# Patient Record
Sex: Female | Born: 1980 | Race: Black or African American | Marital: Single | Smoking: Current every day smoker
Health system: Southern US, Community
[De-identification: ages and names within clinical notes are randomized; demographics above are authoritative.]

## PROBLEM LIST (undated history)

## (undated) DIAGNOSIS — T7840XA Allergy, unspecified, initial encounter: Secondary | ICD-10-CM

## (undated) HISTORY — DX: Allergy, unspecified, initial encounter: T78.40XA

---

## 2002-03-30 ENCOUNTER — Emergency Department (HOSPITAL_COMMUNITY): Admission: EM | Admit: 2002-03-30 | Discharge: 2002-03-30 | Payer: Self-pay | Admitting: Emergency Medicine

## 2004-04-14 ENCOUNTER — Other Ambulatory Visit: Admission: RE | Admit: 2004-04-14 | Discharge: 2004-04-14 | Payer: Self-pay | Admitting: Obstetrics and Gynecology

## 2008-03-28 ENCOUNTER — Emergency Department (HOSPITAL_COMMUNITY): Admission: EM | Admit: 2008-03-28 | Discharge: 2008-03-28 | Payer: Self-pay | Admitting: Emergency Medicine

## 2009-10-25 ENCOUNTER — Emergency Department (HOSPITAL_COMMUNITY): Admission: EM | Admit: 2009-10-25 | Discharge: 2009-10-25 | Payer: Self-pay | Admitting: Emergency Medicine

## 2010-06-03 ENCOUNTER — Encounter: Payer: Self-pay | Admitting: Vascular Surgery

## 2010-07-04 LAB — BASIC METABOLIC PANEL
CO2: 21 mEq/L (ref 19–32)
Chloride: 106 mEq/L (ref 96–112)
GFR calc non Af Amer: >60 mL/min (ref >60)
Glucose, Bld: 100 mg/dL — ABNORMAL HIGH (ref 70–99)
Potassium: 3.9 mEq/L (ref 3.5–5.1)
Sodium: 136 mEq/L (ref 135–145)

## 2010-07-04 LAB — URINALYSIS, ROUTINE W REFLEX MICROSCOPIC
Leukocytes, UA: NEGATIVE
Nitrite: NEGATIVE
Protein, ur: 100 mg/dL — AB
Urobilinogen, UA: 0.2 mg/dL (ref 0.0–1.0)

## 2010-07-04 LAB — URINE MICROSCOPIC-ADD ON

## 2010-07-08 ENCOUNTER — Encounter: Payer: Self-pay | Admitting: Vascular Surgery

## 2011-03-26 ENCOUNTER — Ambulatory Visit (INDEPENDENT_AMBULATORY_CARE_PROVIDER_SITE_OTHER): Payer: 59

## 2011-03-26 DIAGNOSIS — N760 Acute vaginitis: Secondary | ICD-10-CM

## 2011-03-26 DIAGNOSIS — B373 Candidiasis of vulva and vagina: Secondary | ICD-10-CM

## 2011-04-08 ENCOUNTER — Ambulatory Visit (INDEPENDENT_AMBULATORY_CARE_PROVIDER_SITE_OTHER): Payer: 59

## 2011-04-08 DIAGNOSIS — J209 Acute bronchitis, unspecified: Secondary | ICD-10-CM

## 2011-04-08 DIAGNOSIS — J019 Acute sinusitis, unspecified: Secondary | ICD-10-CM

## 2011-06-26 ENCOUNTER — Telehealth: Payer: Self-pay

## 2011-06-26 ENCOUNTER — Ambulatory Visit (INDEPENDENT_AMBULATORY_CARE_PROVIDER_SITE_OTHER): Payer: 59 | Admitting: Emergency Medicine

## 2011-06-26 VITALS — BP 120/79 | HR 97 | Temp 98.6°F | Resp 16 | Ht 64.0 in | Wt 161.4 lb

## 2011-06-26 DIAGNOSIS — B86 Scabies: Secondary | ICD-10-CM

## 2011-06-26 DIAGNOSIS — L299 Pruritus, unspecified: Secondary | ICD-10-CM

## 2011-06-26 MED ORDER — IVERMECTIN 3 MG PO TABS: ORAL_TABLET | ORAL | Status: DC

## 2011-06-26 NOTE — Patient Instructions (Signed)
Scabies Scabies are small bugs (mites) that burrow under the skin and cause red bumps and severe itching. These bugs can only be seen with a microscope. Scabies are highly contagious. They can spread easily from person to person by direct contact. They are also spread through sharing clothing or linens that have the scabies mites living in them. It is not unusual for an entire family to become infected through shared towels, clothing, or bedding.  HOME CARE INSTRUCTIONS   Your caregiver may prescribe a cream or lotion to kill the mites. If this cream is prescribed; massage the cream into the entire area of the body from the neck to the bottom of both feet. Also massage the cream into the scalp and face if your child is less than 1 year old. Avoid the eyes and mouth.   Leave the cream on for 8 to12 hours. Do not wash your hands after application. Your child should bathe or shower after the 8 to 12 hour application period. Sometimes it is helpful to apply the cream to your child at right before bedtime.   One treatment is usually effective and will eliminate approximately 95% of infestations. For severe cases, your caregiver may decide to repeat the treatment in 1 week. Everyone in your household should be treated with one application of the cream.   New rashes or burrows should not appear after successful treatment within 24 to 48 hours; however the itching and rash may last for 2 to 4 weeks after successful treatment. If your symptoms persist longer than this, see your caregiver.   Your caregiver also may prescribe a medication to help with the itching or to help the rash go away more quickly.   Scabies can live on clothing or linens for up to 3 days. Your entire child's recently used clothing, towels, stuffed toys, and bed linens should be washed in hot water and then dried in a dryer for at least 20 minutes on high heat. Items that cannot be washed should be enclosed in a plastic bag for at least 3  days.   To help relieve itching, bathe your child in a cool bath or apply cool washcloths to the affected areas.   Your child may return to school after treatment with the prescribed cream.  SEEK MEDICAL CARE IF:   The itching persists longer than 4 weeks after treatment.   The rash spreads or becomes infected (the area has red blisters or yellow-tan crust).  Document Released: 03/06/2005 Document Revised: 02/23/2011 Document Reviewed: 07/15/2008 ExitCare Patient Information 2012 [employer]. 

## 2011-06-26 NOTE — Progress Notes (Signed)
  Subjective:    Patient ID: Lindsay Marsh, female    DOB: January 04, 1981, 31 y.o.   MRN: [id number]  HPI she presents with onset last week of a pruritic rash which started on the back of her right HEENT and now involves the inside of her left arm and also her trunk. These bumps are extremely pruritic    Review of Systems noncontributory except as relates to this illness. She does not have a partner no children.     Objective:   Physical Exam raised approximately 2-3 mm firm nodular areas with central excoriation which are present on the dorsum of the right hand inside of the left arm anterior axillary line on the right and also over the her back to        Assessment & Plan:  These lesions are most consistent with scabies. It is possible these could be molluscum Halladay extremely pruritic and not really umbilicated they look more excoriated.

## 2011-06-26 NOTE — Telephone Encounter (Signed)
PT STATES SHE WAS JUST SEEN BY DR DAUB AND HE THINK SHE MAY HAVE SCABIES AND WANTED TO MAKE SURE SHE WASN'T AROUND ANYONE. HER MOM IS EDWINA PEARSON DOB [id number] AND SHE HAVE BEEN IN CLOSE CONTACT WITH HER SHE HAS NEVER BEEN SEEN HERE BEFORE, BUT PT WOULD LIKE TO KNOW IF WOULD CALL HER SOMETHING IN PLEASE CALL [phone]    [facility]

## 2011-06-27 NOTE — Telephone Encounter (Signed)
Dr Cleta Alberts, do you want to send in a Rx for pts mother?

## 2011-06-27 NOTE — Telephone Encounter (Signed)
Lindsay Marsh please call the patient's mother and see if she has a rash. If she has a rash M1 like a prescription called in find out the patient's weight because the treatment is based on the patient's weight. I will be happy to call in a prescription she also needs to give [place] the name of the pharmacy her full name and date of birth for me to call that in.

## 2011-06-28 NOTE — Telephone Encounter (Signed)
Please call in ivermectin 3 mg take 5 pills one dose no refills

## 2011-06-28 NOTE — Telephone Encounter (Signed)
Called in Rx and notified pt 

## 2011-06-28 NOTE — Telephone Encounter (Signed)
Pt states she doesn't think her mother actually has any rash, but she was touching the pt's rashes, etc and is very worried about getting scabies. Can we Rx medication for her? Her weight is about 160 lbs, name Lindsay Marsh, DOB [birth year] and she also uses [facility]

## 2011-07-13 ENCOUNTER — Telehealth: Payer: Self-pay

## 2011-07-13 MED ORDER — IVERMECTIN 3 MG PO TABS: ORAL_TABLET | ORAL | Status: DC

## 2011-07-13 NOTE — Telephone Encounter (Signed)
It can be common to need re-treatment. Another Rx has been sent in.  Lindsay Marsh

## 2011-07-13 NOTE — Telephone Encounter (Signed)
.  [facility] PATIENT STATES SHE SAW DR. DAUB A FEW WEEKS AGO FOR SCABIES. SHE IS STILL ITCHING. SHE FINISHED THE ONE TIME DOSE OF MEDICATION. WHEN SHOULD THE ITCHING STOP? BEST PHONE [phone]         [facility]      MBC

## 2011-07-13 NOTE — Telephone Encounter (Signed)
Should we have her come back in or is this common with scabies.

## 2011-07-13 NOTE — Telephone Encounter (Signed)
Called patient. Let her know that another script was called in.

## 2011-07-19 ENCOUNTER — Ambulatory Visit (INDEPENDENT_AMBULATORY_CARE_PROVIDER_SITE_OTHER): Payer: 59 | Admitting: Emergency Medicine

## 2011-07-19 ENCOUNTER — Telehealth: Payer: Self-pay

## 2011-07-19 VITALS — BP 133/76 | HR 97 | Temp 99.2°F | Resp 16 | Ht 63.0 in | Wt 160.0 lb

## 2011-07-19 DIAGNOSIS — B86 Scabies: Secondary | ICD-10-CM

## 2011-07-19 DIAGNOSIS — L708 Other acne: Secondary | ICD-10-CM

## 2011-07-19 DIAGNOSIS — L709 Acne, unspecified: Secondary | ICD-10-CM

## 2011-07-19 MED ORDER — BENZOYL PEROXIDE-ERYTHROMYCIN 5-3 % EX GEL: Freq: Two times a day (BID) | CUTANEOUS | Status: DC

## 2011-07-19 NOTE — Telephone Encounter (Signed)
Pt states that she was diagnosed with having scabies, she is beginning to have bumps on her face now but the bumps on her body are clearing up. Pt would like to know what should she do.

## 2011-07-19 NOTE — Patient Instructions (Signed)
Acne  Acne is a skin problem that causes pimples. Acne occurs when the pores in your skin get blocked. Your pores may become red, sore, and swollen (inflamed), or infected with a common skin bacterium (Propionibacterium acnes). Acne is a common skin problem. Up to 80% of people get acne at some time. Acne is especially common from the ages of 12 to 24. Acne usually goes away over time with proper treatment.  CAUSES    Your pores each contain an oil gland. The oil glands make an oily substance called sebum. Acne happens when these glands get plugged with sebum, dead skin cells, and dirt. The P. acnes bacteria that are normally found in the oil glands then multiply, causing inflammation. Acne is commonly triggered by changes in your hormones. These hormonal changes can cause the oil glands to get bigger and to make more sebum. Factors that can make acne worse include:   Hormone changes during adolescence.    Hormone changes during women's menstrual cycles.    Hormone changes during pregnancy.    Oil-based cosmetics and hair products.    Harshly scrubbing the skin.    Strong soaps.    Stress.    Hormone problems due to certain diseases.    Long or oily hair rubbing against the skin.    Certain medicines.    Pressure from headbands, backpacks, or shoulder pads.    Exposure to certain oils and chemicals.   SYMPTOMS    Acne often occurs on the face, neck, chest, and upper back. Symptoms include:   Small, red bumps (pimples or papules).    Whiteheads (closed comedones).    Blackheads (open comedones).    Small, pus-filled pimples (pustules).    Big, red pimples or pustules that feel tender.   More severe acne can cause:   An infected area that contains a collection of pus (abscess).    Hard, painful, fluid-filled sacs (cysts).    Scars.   DIAGNOSIS   Your caregiver can usually tell what the problem is by doing a physical exam.  TREATMENT     There are many good treatments for acne. Some are available over-the-counter and some are available with a prescription. The treatment that is best for you depends on the type of acne you have and how severe it is. It may take 2 months of treatment before your acne gets better. Common treatments include:   Creams and lotions that prevent oil glands from clogging.    Creams and lotions that treat or prevent infections and inflammation.    Antibiotics applied to the skin or taken as a pill.    Pills that decrease sebum production.    Birth control pills.    Light or laser treatments.    Minor surgery.    Injections of medicine into the affected areas.    Chemicals that cause peeling of the skin.   HOME CARE INSTRUCTIONS    Good skin care is the most important part of treatment.   Wash your skin gently at least twice a day and after exercise. Always wash your skin before bed.    Use mild soap.    After each wash, apply a water-based skin moisturizer.    Keep your hair clean and off of your face. Shampoo your hair daily.    Only take medicines as directed by your caregiver.    Use a sunscreen or sunblock with SPF 30 or greater. This is especially important when you are using acne   medicines.    Choose cosmetics that are noncomedogenic. This means they do not plug the oil glands.    Avoid leaning your chin or forehead on your hands.    Avoid wearing tight headbands or hats.    Avoid picking or squeezing your pimples. This can make your acne worse and cause scarring.   SEEK MEDICAL CARE IF:     Your acne is not better after 8 weeks.    Your acne gets worse.    You have a large area of skin that is red or tender.   Document Released: 03/03/2000 Document Revised: 02/23/2011 Document Reviewed: 12/23/2010  ExitCare Patient Information 2012 [employer].

## 2011-07-19 NOTE — Telephone Encounter (Signed)
Spoke with pt--she will rtc for eval.

## 2011-07-19 NOTE — Telephone Encounter (Signed)
SPOKE WITH PT--SAW DR Cleta Alberts AT BEGINNING OF April. MAJORITY OF SCABIES BUMPS WENT DOWN. HE RE-PRESCRIBED THE MED LAST Thursday. SHE SAYS SHE NOW IS BREAKING OUT ON HER FACE, IN A FORMATION OF A LINE--LIKE IT WAS ON HER CHEST. PT WANTS TO KNOW WHAT SHE SHOULD DO NOW.

## 2011-07-19 NOTE — Progress Notes (Signed)
  Subjective:    Patient ID: Lindsay Marsh, female    DOB: [birth year], 31 y.o.   MRN: [id number]  HPI patient enters for followup of her scabies. She was treated with ivermectin. The areas beneath both arms have cleared. She has an outbreak on both cheeks. This is slightly  pruritic on the left. She has no history of acne.    Review of Systems chest as relates to her skin and the present illness     Objective:   Physical Exam there is an acneform eruption of both cheeks. The areas where she had her scabies on the inside of both arms and hands are now clear.        Assessment & Plan:  Her scabies outbreak is now subsided. She has an acne eruption on her face and we'll treat with topical Benzamycin.

## 2011-07-19 NOTE — Telephone Encounter (Signed)
Dr. Cleta Alberts is here today at 2 pm, please return to clinic for eval.

## 2011-07-24 ENCOUNTER — Encounter (HOSPITAL_COMMUNITY): Payer: Self-pay | Admitting: Emergency Medicine

## 2011-07-24 ENCOUNTER — Emergency Department (HOSPITAL_COMMUNITY)
Admission: EM | Admit: 2011-07-24 | Discharge: 2011-07-24 | Disposition: A | Discharge status: Home / Self Care | Payer: 59 | Attending: Emergency Medicine | Admitting: Emergency Medicine

## 2011-07-24 DIAGNOSIS — B86 Scabies: Secondary | ICD-10-CM | POA: Insufficient documentation

## 2011-07-24 MED ORDER — PERMETHRIN 5 % EX CREA: TOPICAL_CREAM | CUTANEOUS | Status: AC

## 2011-07-24 NOTE — Discharge Instructions (Signed)
Scabies Scabies are small bugs (mites) that burrow under the skin and cause red bumps and severe itching. These bugs can only be seen with a microscope. Scabies are highly contagious. They can spread easily from person to person by direct contact. They are also spread through sharing clothing or linens that have the scabies mites living in them. It is not unusual for an entire family to become infected through shared towels, clothing, or bedding.  HOME CARE INSTRUCTIONS   Your caregiver may prescribe a cream or lotion to kill the mites. If this cream is prescribed; massage the cream into the entire area of the body from the neck to the bottom of both feet. Also massage the cream into the scalp and face if your child is less than 1 year old. Avoid the eyes and mouth.   Leave the cream on for 8 to12 hours. Do not wash your hands after application. Your child should bathe or shower after the 8 to 12 hour application period. Sometimes it is helpful to apply the cream to your child at right before bedtime.   One treatment is usually effective and will eliminate approximately 95% of infestations. For severe cases, your caregiver may decide to repeat the treatment in 1 week. Everyone in your household should be treated with one application of the cream.   New rashes or burrows should not appear after successful treatment within 24 to 48 hours; however the itching and rash may last for 2 to 4 weeks after successful treatment. If your symptoms persist longer than this, see your caregiver.   Your caregiver also may prescribe a medication to help with the itching or to help the rash go away more quickly.   Scabies can live on clothing or linens for up to 3 days. Your entire child's recently used clothing, towels, stuffed toys, and bed linens should be washed in hot water and then dried in a dryer for at least 20 minutes on high heat. Items that cannot be washed should be enclosed in a plastic bag for at least 3  days.   To help relieve itching, bathe your child in a cool bath or apply cool washcloths to the affected areas.   Your child may return to school after treatment with the prescribed cream.  SEEK MEDICAL CARE IF:   The itching persists longer than 4 weeks after treatment.   The rash spreads or becomes infected (the area has red blisters or yellow-tan crust).  Document Released: 03/06/2005 Document Revised: 02/23/2011 Document Reviewed: 07/15/2008 ExitCare Patient Information 2012 [employer]. 

## 2011-07-24 NOTE — ED Notes (Signed)
PA NOTIFIED ON PT'S LOCATION / COMPLAINTS.

## 2011-07-24 NOTE — ED Notes (Signed)
PT. LEFT AFTER RECEIVING DISCHARGE INSTRUCTIONS / P[RESCRIPTION , DID NOT WAIT FOR PA TO ANSWER HER QUESTIONS AT DISCHARGE.

## 2011-07-24 NOTE — ED Provider Notes (Signed)
History     CSN: [id number]  Arrival date & time 07/24/11  4098   First MD Initiated Contact with Patient 07/24/11 2212      Chief Complaint  Patient presents with  . Rash    (Consider location/radiation/quality/duration/timing/severity/associated sxs/prior treatment) HPI Comments: Patient here with recurrent rash. States was treated at an urgent care several weeks ago and given medication for the rash. States it continues to return. States itching and scalp itching as well. States no one else has the rash was given refills or "scabies ". Denies fevers chills. Would like referral to dermatology.  Patient is a 31 y.o. female presenting with rash. The history is provided by the patient. No language interpreter was used.  Rash  This is a new problem. The current episode started more than 1 week ago. The problem has not changed since onset.The problem is associated with nothing. There has been no fever. The rash is present on the trunk, right arm and left arm. The pain is at a severity of 0/10. The patient is experiencing no pain. The pain has been constant since onset. Associated symptoms include itching. Pertinent negatives include no blisters, no pain and no weeping. She has tried nothing for the symptoms. The treatment provided no relief.    History reviewed. No pertinent past medical history.  History reviewed. No pertinent past surgical history.  No family history on file.  History  Substance Use Topics  . Smoking status: Current Everyday Smoker -- 0.3 packs/day  . Smokeless tobacco: Not on file  . Alcohol Use: Yes    OB History    Grav Para Term Preterm Abortions TAB SAB Ect Mult Living                  Review of Systems  Skin: Positive for itching and rash.  All other systems reviewed and are negative.    Allergies  Review of patient's allergies indicates no known allergies.  Home Medications   Current Outpatient Rx  Name Route Sig Dispense Refill  .  LORATADINE-PSEUDOEPHEDRINE ER 10-240 MG PO TB24 Oral Take 1 tablet by mouth daily as needed. For allergies      BP 135/91  Pulse 87  Temp(Src) 98.2 F (36.8 C) (Oral)  Resp 18  SpO2 100%  LMP 07/15/2011  Physical Exam  Nursing note and vitals reviewed. Constitutional: She is oriented to person, place, and time. She appears well-developed and well-nourished. No distress.  HENT:  Head: Normocephalic and atraumatic.  Right Ear: External ear normal.  Left Ear: External ear normal.  Nose: Nose normal.  Mouth/Throat: Oropharynx is clear and moist. No oropharyngeal exudate.  Eyes: Conjunctivae are normal. Pupils are equal, round, and reactive to light. No scleral icterus.  Neck: Normal range of motion. Neck supple.  Cardiovascular: Normal rate, regular rhythm and normal heart sounds.  Exam reveals no gallop and no friction rub.   No murmur heard. Pulmonary/Chest: Effort normal and breath sounds normal. No respiratory distress. She has no wheezes. She has no rales. She exhibits no tenderness.  Abdominal: Soft. Bowel sounds are normal. She exhibits no distension and no mass. There is no tenderness. There is no rebound and no guarding.  Musculoskeletal: Normal range of motion. She exhibits no edema and no tenderness.  Lymphadenopathy:    She has no cervical adenopathy.  Neurological: She is alert and oriented to person, place, and time. No cranial nerve deficit.  Skin: Skin is warm and dry. Rash noted. No erythema. No pallor.  Diffuse papular rash noted to bilateral upper arms chest. Reports this rash is itchy.  Psychiatric: She has a normal mood and affect. Her behavior is normal. Judgment and thought content normal.    ED Course  Procedures (including critical care time)  Labs Reviewed - No data to display No results found.   Scabies    MDM  Patient treated previously with pills for scabies will place her on a cream for this. And have her followup with  dermatology.        Izola Price [place], [place] 07/24/11 [phone]

## 2011-07-24 NOTE — ED Notes (Signed)
NURSE EXPLAINED DELAY AND PROCESS.  

## 2011-07-24 NOTE — ED Notes (Signed)
PT. REPROTS PERSISTENT RASH AT ARMS , HANDS AND BACK FOR SEVERAL DAYS , SEEN AT AN URGENT  CARE SEVERAL WEEKS AGO DIAGNOSED WITH SCABIES.

## 2011-07-25 NOTE — ED Provider Notes (Signed)
Medical screening examination/treatment/procedure(s) were performed by non-physician practitioner and as supervising physician I was immediately available for consultation/collaboration. Osvaldo Human, M.D.  Carleene Cooper III, MD 07/25/11 1116

## 2011-11-18 ENCOUNTER — Ambulatory Visit (INDEPENDENT_AMBULATORY_CARE_PROVIDER_SITE_OTHER): Payer: 59 | Admitting: Family Medicine

## 2011-11-18 VITALS — BP 114/78 | HR 89 | Temp 98.4°F | Resp 16 | Ht 63.5 in | Wt 161.8 lb

## 2011-11-18 DIAGNOSIS — Z3200 Encounter for pregnancy test, result unknown: Secondary | ICD-10-CM

## 2011-11-18 DIAGNOSIS — J329 Chronic sinusitis, unspecified: Secondary | ICD-10-CM

## 2011-11-18 DIAGNOSIS — N760 Acute vaginitis: Secondary | ICD-10-CM

## 2011-11-18 LAB — POCT URINALYSIS DIPSTICK
Bilirubin, UA: NEGATIVE
Glucose, UA: NEGATIVE
Ketones, UA: NEGATIVE
Leukocytes, UA: NEGATIVE
Nitrite, UA: NEGATIVE
Protein, UA: NEGATIVE
Spec Grav, UA: 1.02
Urobilinogen, UA: 0.2
pH, UA: 7

## 2011-11-18 LAB — POCT UA - MICROSCOPIC ONLY
Casts, Ur, LPF, POC: NEGATIVE
Crystals, Ur, HPF, POC: NEGATIVE
Yeast, UA: NEGATIVE

## 2011-11-18 LAB — POCT WET PREP WITH KOH
KOH Prep POC: NEGATIVE
Trichomonas, UA: NEGATIVE
Yeast Wet Prep HPF POC: NEGATIVE

## 2011-11-18 LAB — POCT URINE PREGNANCY: Preg Test, Ur: NEGATIVE

## 2011-11-18 MED ORDER — CLINDAMYCIN HCL 150 MG PO CAPS: 150.0000 mg | ORAL_CAPSULE | Freq: Three times a day (TID) | ORAL | Status: AC

## 2011-11-18 NOTE — Patient Instructions (Addendum)

## 2011-11-18 NOTE — Progress Notes (Signed)
31 year old [place] female is here today with complaints of Sinus pressure, Headache, nasal congestion (green mucous), cough- dry and chest comgestion for five days. Pt also states that she has had vaginal discomfort for a week and a half.  Pt states that she has no discharge or odor but a little itchy. Pt states she a history of bacterial or yeast  infection without showing or having any symptoms. Pt states the last time she was prescribed Flagyl, she feels like she had a reaction to the antibiotic because of a rash that appeared on the right side of her back and elbow. Pt states she has no other complaints.  Works in accounts payable.  No epistaxis or recreational drugs  Vaginal sx:  Patient requests STS check. LMP:  17 Aug G1Ab1 Contraception:  None Last Pap:  This year; normal  Objective:  NAD Nose:  Erosion lateral left nostril just inside Oroph:  Clear Neck:  Supple, no adenopathy TM:  Normal Chest:  Clear  EXT genit: normal Vagina: white mucoid discharge Bimanual:  Mildly deviated uterus to the right, no masses or adnexal swelling  Results for orders placed in visit on 11/18/11  POCT URINALYSIS DIPSTICK      Component Value Range   Color, UA yellow     Clarity, UA hazy     Glucose, UA neg     Bilirubin, UA neg     Ketones, UA neg     Spec Grav, UA 1.020     Blood, UA moderate     pH, UA 7.0     Protein, UA neg     Urobilinogen, UA 0.2     Nitrite, UA neg     Leukocytes, UA Negative    POCT UA - MICROSCOPIC ONLY      Component Value Range   WBC, Ur, HPF, POC 2-3     RBC, urine, microscopic 8-12     Bacteria, U Microscopic small     Mucus, UA trace     Epithelial cells, urine per micros 2-5     Crystals, Ur, HPF, POC neg     Casts, Ur, LPF, POC neg     Yeast, UA neg    POCT WET PREP WITH KOH      Component Value Range   Trichomonas, UA Negative     Clue Cells Wet Prep HPF POC 0-2     Epithelial Wet Prep HPF POC 5-8     Yeast Wet Prep HPF POC neg     Bacteria  Wet Prep HPF POC 1+     RBC Wet Prep HPF POC 0-2     WBC Wet Prep HPF POC 2-4     KOH Prep POC Negative    POCT URINE PREGNANCY      Component Value Range   Preg Test, Ur Negative       Assessment:  Sinusitis with BV  Plan:  Clindamycin 150 tid x 7 days  I've warned patient about GI side effects and asked her to return in 2 to 4 weeks to recheck the left nasal lesion  Also, advised that she taper and quit the cigarettes.

## 2011-11-21 LAB — GC/CHLAMYDIA PROBE AMP, GENITAL
Chlamydia, DNA Probe: NEGATIVE
GC Probe Amp, Genital: NEGATIVE

## 2013-02-02 ENCOUNTER — Ambulatory Visit (INDEPENDENT_AMBULATORY_CARE_PROVIDER_SITE_OTHER): Payer: 59 | Admitting: Emergency Medicine

## 2013-02-02 VITALS — BP 134/80 | HR 94 | Temp 98.3°F | Resp 16 | Ht 63.5 in | Wt 177.8 lb

## 2013-02-02 DIAGNOSIS — A499 Bacterial infection, unspecified: Secondary | ICD-10-CM

## 2013-02-02 DIAGNOSIS — N898 Other specified noninflammatory disorders of vagina: Secondary | ICD-10-CM

## 2013-02-02 DIAGNOSIS — B9689 Other specified bacterial agents as the cause of diseases classified elsewhere: Secondary | ICD-10-CM

## 2013-02-02 DIAGNOSIS — N760 Acute vaginitis: Secondary | ICD-10-CM

## 2013-02-02 LAB — POCT CBC
Granulocyte percent: 43.2 %G (ref 37–80)
MID (cbc): 0.4 (ref 0–0.9)
POC Granulocyte: 2 (ref 2–6.9)
POC LYMPH PERCENT: 49.1 %L (ref 10–50)
POC MID %: 7.7 %M (ref 0–12)
Platelet Count, POC: 304 10*3/uL (ref 142–424)
RDW, POC: 13.4 %

## 2013-02-02 LAB — POCT WET PREP WITH KOH
Epithelial Wet Prep HPF POC: NEGATIVE
RBC Wet Prep HPF POC: NEGATIVE

## 2013-02-02 LAB — POCT URINALYSIS DIPSTICK
Bilirubin, UA: NEGATIVE
Ketones, UA: NEGATIVE
Leukocytes, UA: NEGATIVE
Nitrite, UA: NEGATIVE

## 2013-02-02 LAB — POCT UA - MICROSCOPIC ONLY

## 2013-02-02 LAB — POCT URINE PREGNANCY: Preg Test, Ur: NEGATIVE

## 2013-02-02 MED ORDER — CLINDAMYCIN PHOSPHATE 2 % VA CREA: 1.0000 | TOPICAL_CREAM | Freq: Every day | VAGINAL | Status: DC

## 2013-02-02 NOTE — Patient Instructions (Signed)
Bacterial Vaginosis Bacterial vaginosis (BV) is a vaginal infection where the normal balance of bacteria in the vagina is disrupted. The normal balance is then replaced by an overgrowth of certain bacteria. There are several different kinds of bacteria that can cause BV. BV is the most common vaginal infection in women of childbearing age. CAUSES   The cause of BV is not fully understood. BV develops when there is an increase or imbalance of harmful bacteria.  Some activities or behaviors can upset the normal balance of bacteria in the vagina and put women at increased risk including:  Having a new sex partner or multiple sex partners.  Douching.  Using an intrauterine device (IUD) for contraception.  It is not clear what role sexual activity plays in the development of BV. However, women that have never had sexual intercourse are rarely infected with BV. Women do not get BV from toilet seats, bedding, swimming pools or from touching objects around them.  SYMPTOMS   Grey vaginal discharge.  A fish-like odor with discharge, especially after sexual intercourse.  Itching or burning of the vagina and vulva.  Burning or pain with urination.  Some women have no signs or symptoms at all. DIAGNOSIS  Your caregiver must examine the vagina for signs of BV. Your caregiver will perform lab tests and look at the sample of vaginal fluid through a microscope. They will look for bacteria and abnormal cells (clue cells), a pH test higher than 4.5, and a positive amine test all associated with BV.  RISKS AND COMPLICATIONS   Pelvic inflammatory disease (PID).  Infections following gynecology surgery.  Developing HIV.  Developing herpes virus. TREATMENT  Sometimes BV will clear up without treatment. However, all women with symptoms of BV should be treated to avoid complications, especially if gynecology surgery is planned. Female partners generally do not need to be treated. However, BV may spread  between female sex partners so treatment is helpful in preventing a recurrence of BV.   BV may be treated with antibiotics. The antibiotics come in either pill or vaginal cream forms. Either can be used with nonpregnant or pregnant women, but the recommended dosages differ. These antibiotics are not harmful to the baby.  BV can recur after treatment. If this happens, a second round of antibiotics will often be prescribed.  Treatment is important for pregnant women. If not treated, BV can cause a premature delivery, especially for a pregnant woman who had a premature birth in the past. All pregnant women who have symptoms of BV should be checked and treated.  For chronic reoccurrence of BV, treatment with a type of prescribed gel vaginally twice a week is helpful. HOME CARE INSTRUCTIONS   Finish all medication as directed by your caregiver.  Do not have sex until treatment is completed.  Tell your sexual partner that you have a vaginal infection. They should see their caregiver and be treated if they have problems, such as a mild rash or itching.  Practice safe sex. Use condoms. Only have 1 sex partner. PREVENTION  Basic prevention steps can help reduce the risk of upsetting the natural balance of bacteria in the vagina and developing BV:  Do not have sexual intercourse (be abstinent).  Do not douche.  Use all of the medicine prescribed for treatment of BV, even if the signs and symptoms go away.  Tell your sex partner if you have BV. That way, they can be treated, if needed, to prevent reoccurrence. SEEK MEDICAL CARE IF:     Your symptoms are not improving after 3 days of treatment.  You have increased discharge, pain, or fever. MAKE SURE YOU:   Understand these instructions.  Will watch your condition.  Will get help right away if you are not doing well or get worse. FOR MORE INFORMATION  Division of STD Prevention (DSTDP), Centers for Disease Control and Prevention:  www.cdc.gov/std American [employer] Association (ASHA): www.ashastd.org  Document Released: 03/06/2005 Document Revised: 05/29/2011 Document Reviewed: 10/16/2012 [employer] Patient Information 2014 [employer].  

## 2013-02-02 NOTE — Progress Notes (Signed)
[facility] [address] [place] [address] [phone]  Date:  02/02/2013   Name:  Charmayne Odell   DOB:  [birth year]   MRN:  [id number]  PCP:  Default, Provider, MD    Chief Complaint: Vaginal Discharge and Abdominal Pain   History of Present Illness:  Nicky Milhouse is a 32 y.o. very pleasant female patient who presents with the following:  Had an IUD removed 2 months ago.  Had intercourse once in interval and was unprotected and developed crampy pelvic pain 6 weeks later (2 weeks ago).  Has no discharge but has a foul smell.  No dysuria, urgency or frequency.  LMP just ended.  There are no active problems to display for this patient.   History reviewed. No pertinent past medical history.  History reviewed. No pertinent past surgical history.  History  Substance Use Topics  . Smoking status: Current Every Day Smoker -- 6.00 packs/day    Types: Cigarettes  . Smokeless tobacco: Current User  . Alcohol Use: Yes     Comment: Socially    Family History  Problem Relation Age of Onset  . Diabetes Maternal Grandmother   . Stroke Maternal Grandfather     Allergies  Allergen Reactions  . Flagyl [Metronidazole] Rash    Medication list has been reviewed and updated.  Current Outpatient Prescriptions on File Prior to Visit  Medication Sig Dispense Refill  . loratadine-pseudoephedrine (CLARITIN-D 24-HOUR) 10-240 MG per 24 hr tablet Take 1 tablet by mouth daily as needed. For allergies       No current facility-administered medications on file prior to visit.    Review of Systems:  As per HPI, otherwise negative.    Physical Examination: Filed Vitals:   02/02/13 0818  BP: 134/80  Pulse: 94  Temp: 98.3 F (36.8 C)  Resp: 16   Filed Vitals:   02/02/13 0818  Height: 5' 3.5" (1.613 m)  Weight: 177 lb 12.8 oz (80.65 kg)   Body mass index is 31 kg/(m^2). Ideal Body Weight: Weight in (lb) to have BMI = 25: 143.1  Physical  Examination: Pelvic - normal external genitalia, vulva, vagina, cervix, uterus and adnexa, exam chaperoned by Lurena Joiner   Assessment and Plan: BV Clindamycin due flagyl allergy  Signed,  Phillips Odor, MD  Results for orders placed in visit on 02/02/13  POCT WET PREP WITH KOH      Result Value Range   Trichomonas, UA Negative     Clue Cells Wet Prep HPF POC TNTC     Epithelial Wet Prep HPF POC neg     Yeast Wet Prep HPF POC neg     Bacteria Wet Prep HPF POC 4+     RBC Wet Prep HPF POC neg     WBC Wet Prep HPF POC 2-3     KOH Prep POC Negative    POCT CBC      Result Value Range   WBC 4.7  4.6 - 10.2 K/uL   Lymph, poc 2.3  0.6 - 3.4   POC LYMPH PERCENT 49.1  10 - 50 %L   MID (cbc) 0.4  0 - 0.9   POC MID % 7.7  0 - 12 %M   POC Granulocyte 2.0  2 - 6.9   Granulocyte percent 43.2  37 - 80 %G   RBC 4.73  4.04 - 5.48 M/uL   Hemoglobin 13.6  12.2 - 16.2 g/dL   HCT, POC 66.4  40.3 -  47.9 %   MCV 91.3  80 - 97 fL   MCH, POC 28.8  27 - 31.2 pg   MCHC 31.5 (*) 31.8 - 35.4 g/dL   RDW, POC 16.1     Platelet Count, POC 304  142 - 424 K/uL   MPV 8.2  0 - 99.8 fL  POCT UA - MICROSCOPIC ONLY      Result Value Range   WBC, Ur, HPF, POC 0-8     RBC, urine, microscopic 8-15     Bacteria, U Microscopic 1+     Mucus, UA neg     Epithelial cells, urine per micros 0-2     Crystals, Ur, HPF, POC neg     Casts, Ur, LPF, POC neg     Yeast, UA neg    POCT URINALYSIS DIPSTICK      Result Value Range   Color, UA yellow     Clarity, UA clear     Glucose, UA neg     Bilirubin, UA neg     Ketones, UA neg     Spec Grav, UA >=1.030     Blood, UA large     pH, UA 5.5     Protein, UA trace     Urobilinogen, UA 0.2     Nitrite, UA neg     Leukocytes, UA Negative    POCT URINE PREGNANCY      Result Value Range   Preg Test, Ur Negative

## 2013-02-03 LAB — GC/CHLAMYDIA PROBE AMP: CT Probe RNA: NEGATIVE

## 2013-02-18 ENCOUNTER — Telehealth: Payer: Self-pay

## 2013-02-18 NOTE — Telephone Encounter (Signed)
PT STATES SHE HAS DEVELOPED A YEAST INFECTION FROM THE MEDICINE AND WOULD LIKE TO HAVE SOMETHING CALLED IN PLEASE CALL PT AT [phone]   [facility] [address]

## 2013-02-19 ENCOUNTER — Other Ambulatory Visit: Payer: Self-pay | Admitting: Emergency Medicine

## 2013-02-19 MED ORDER — FLUCONAZOLE 150 MG PO TABS: 150.0000 mg | ORAL_TABLET | Freq: Once | ORAL | Status: DC

## 2013-02-19 NOTE — Telephone Encounter (Signed)
Sent diflucan.  Thanks.  

## 2013-05-23 ENCOUNTER — Encounter: Payer: Self-pay | Admitting: Family Medicine

## 2013-05-23 ENCOUNTER — Ambulatory Visit (INDEPENDENT_AMBULATORY_CARE_PROVIDER_SITE_OTHER): Payer: 59 | Admitting: Family Medicine

## 2013-05-23 ENCOUNTER — Ambulatory Visit: Payer: 59

## 2013-05-23 VITALS — BP 133/74 | HR 92 | Temp 98.3°F | Resp 16 | Ht 64.0 in | Wt 179.0 lb

## 2013-05-23 DIAGNOSIS — M546 Pain in thoracic spine: Secondary | ICD-10-CM

## 2013-05-23 DIAGNOSIS — T148XXA Other injury of unspecified body region, initial encounter: Secondary | ICD-10-CM

## 2013-05-23 DIAGNOSIS — M542 Cervicalgia: Secondary | ICD-10-CM

## 2013-05-23 MED ORDER — CYCLOBENZAPRINE HCL 5 MG PO TABS: 5.0000 mg | ORAL_TABLET | Freq: Every evening | ORAL | Status: DC | PRN

## 2013-05-23 NOTE — Patient Instructions (Signed)
Back Exercises Back exercises help treat and prevent back injuries. The goal of back exercises is to increase the strength of your abdominal and back muscles and the flexibility of your back. These exercises should be started when you no longer have back pain. Back exercises include:  Pelvic Tilt. Lie on your back with your knees bent. Tilt your pelvis until the lower part of your back is against the floor. Hold this position 5 to 10 sec and repeat 5 to 10 times.  Knee to Chest. Pull first 1 knee up against your chest and hold for 20 to 30 seconds, repeat this with the other knee, and then both knees. This may be done with the other leg straight or bent, whichever feels better.  Sit-Ups or Curl-Ups. Bend your knees 90 degrees. Start with tilting your pelvis, and do a partial, slow sit-up, lifting your trunk only 30 to 45 degrees off the floor. Take at least 2 to 3 seconds for each sit-up. Do not do sit-ups with your knees out straight. If partial sit-ups are difficult, simply do the above but with only tightening your abdominal muscles and holding it as directed.  Hip-Lift. Lie on your back with your knees flexed 90 degrees. Push down with your feet and shoulders as you raise your hips a couple inches off the floor; hold for 10 seconds, repeat 5 to 10 times.  Back arches. Lie on your stomach, propping yourself up on bent elbows. Slowly press on your hands, causing an arch in your low back. Repeat 3 to 5 times. Any initial stiffness and discomfort should lessen with repetition over time.  Shoulder-Lifts. Lie face down with arms beside your body. Keep hips and torso pressed to floor as you slowly lift your head and shoulders off the floor. Do not overdo your exercises, especially in the beginning. Exercises may cause you some mild back discomfort which lasts for a few minutes; however, if the pain is more severe, or lasts for more than 15 minutes, do not continue exercises until you see your caregiver.  Improvement with exercise therapy for back problems is slow.  See your caregivers for assistance with developing a proper back exercise program. Document Released: 04/13/2004 Document Revised: 05/29/2011 Document Reviewed: 01/05/2011 [employer] Patient Information 2014 [employer]. Cervical Strain and Sprain (Whiplash) with Rehab Cervical strain and sprains are injuries that commonly occur with "whiplash" injuries. Whiplash occurs when the neck is forcefully whipped backward or forward, such as during a motor vehicle accident. The muscles, ligaments, tendons, discs and nerves of the neck are susceptible to injury when this occurs. SYMPTOMS   Pain or stiffness in the front and/or back of neck  Symptoms may present immediately or up to 24 hours after injury.  Dizziness, headache, nausea and vomiting.  Muscle spasm with soreness and stiffness in the neck.  Tenderness and swelling at the injury site. CAUSES  Whiplash injuries often occur during contact sports or motor vehicle accidents.  RISK INCREASES WITH:  Osteoarthritis of the spine.  Situations that make head or neck accidents or trauma more likely.  High-risk sports (football, rugby, wrestling, hockey, auto racing, gymnastics, diving, contact karate or boxing).  Poor strength and flexibility of the neck.  Previous neck injury.  Poor tackling technique.  Improperly fitted or padded equipment. PREVENTION  Learn and use proper technique (avoid tackling with the head, spearing and head-butting; use proper falling techniques to avoid landing on the head).  Warm up and stretch properly before activity.  Maintain   physical fitness:  Strength, flexibility and endurance.  Cardiovascular fitness.  Wear properly fitted and padded protective equipment, such as padded soft collars, for participation in contact sports. PROGNOSIS  Recovery for cervical strain and sprain injuries is dependent on the extent of the injury. These  injuries are usually curable in 1 week to 3 months with appropriate treatment.  RELATED COMPLICATIONS   Temporary numbness and weakness may occur if the nerve roots are damaged, and this may persist until the nerve has completely healed.  Chronic pain due to frequent recurrence of symptoms.  Prolonged healing, especially if activity is resumed too soon (before complete recovery). TREATMENT  Treatment initially involves the use of ice and medication to help reduce pain and inflammation. It is also important to perform strengthening and stretching exercises and modify activities that worsen symptoms so the injury does not get worse. These exercises may be performed at home or with a therapist. For patients who experience severe symptoms, a soft padded collar may be recommended to be worn around the neck.  Improving your posture may help reduce symptoms. Posture improvement includes pulling your chin and abdomen in while sitting or standing. If you are sitting, sit in a firm chair with your buttocks against the back of the chair. While sleeping, try replacing your pillow with a small towel rolled to 2 inches in diameter, or use a cervical pillow or soft cervical collar. Poor sleeping positions delay healing.  For patients with nerve root damage, which causes numbness or weakness, the use of a cervical traction apparatus may be recommended. Surgery is rarely necessary for these injuries. However, cervical strain and sprains that are present at birth (congenital) may require surgery. MEDICATION   If pain medication is necessary, nonsteroidal anti-inflammatory medications, such as aspirin and ibuprofen, or other minor pain relievers, such as acetaminophen, are often recommended.  Do not take pain medication for 7 days before surgery.  Prescription pain relievers may be given if deemed necessary by your caregiver. Use only as directed and only as much as you need. HEAT AND COLD:   Cold treatment  (icing) relieves pain and reduces inflammation. Cold treatment should be applied for 10 to 15 minutes every 2 to 3 hours for inflammation and pain and immediately after any activity that aggravates your symptoms. Use ice packs or an ice massage.  Heat treatment may be used prior to performing the stretching and strengthening activities prescribed by your caregiver, physical therapist, or athletic trainer. Use a heat pack or a warm soak. SEEK MEDICAL CARE IF:   Symptoms get worse or do not improve in 2 weeks despite treatment.  New, unexplained symptoms develop (drugs used in treatment may produce side effects). EXERCISES RANGE OF MOTION (ROM) AND STRETCHING EXERCISES - Cervical Strain and Sprain These exercises may help you when beginning to rehabilitate your injury. In order to successfully resolve your symptoms, you must improve your posture. These exercises are designed to help reduce the forward-head and rounded-shoulder posture which contributes to this condition. Your symptoms may resolve with or without further involvement from your physician, physical therapist or athletic trainer. While completing these exercises, remember:   Restoring tissue flexibility helps normal motion to return to the joints. This allows healthier, less painful movement and activity.  An effective stretch should be held for at least 20 seconds, although you may need to begin with shorter hold times for comfort.  A stretch should never be painful. You should only feel a gentle lengthening or release   in the stretched tissue. STRETCH- Axial Extensors  Lie on your back on the floor. You may bend your knees for comfort. Place a rolled up hand towel or dish towel, about 2 inches in diameter, under the part of your head that makes contact with the floor.  Gently tuck your chin, as if trying to make a "double chin," until you feel a gentle stretch at the base of your head.  Hold __________ seconds. Repeat __________  times. Complete this exercise __________ times per day.  STRETECH - Axial Extension   Stand or sit on a firm surface. Assume a good posture: chest up, shoulders drawn back, abdominal muscles slightly tense, knees unlocked (if standing) and feet hip width apart.  Slowly retract your chin so your head slides back and your chin slightly lowers.Continue to look straight ahead.  You should feel a gentle stretch in the back of your head. Be certain not to feel an aggressive stretch since this can cause headaches later.  Hold for __________ seconds. Repeat __________ times. Complete this exercise __________ times per day. STRETCH  Cervical Side Bend   Stand or sit on a firm surface. Assume a good posture: chest up, shoulders drawn back, abdominal muscles slightly tense, knees unlocked (if standing) and feet hip width apart.  Without letting your nose or shoulders move, slowly tip your right / left ear to your shoulder until your feel a gentle stretch in the muscles on the opposite side of your neck.  Hold __________ seconds. Repeat __________ times. Complete this exercise __________ times per day. STRETCH  Cervical Rotators   Stand or sit on a firm surface. Assume a good posture: chest up, shoulders drawn back, abdominal muscles slightly tense, knees unlocked (if standing) and feet hip width apart.  Keeping your eyes level with the ground, slowly turn your head until you feel a gentle stretch along the back and opposite side of your neck.  Hold __________ seconds. Repeat __________ times. Complete this exercise __________ times per day. RANGE OF MOTION - Neck Circles   Stand or sit on a firm surface. Assume a good posture: chest up, shoulders drawn back, abdominal muscles slightly tense, knees unlocked (if standing) and feet hip width apart.  Gently roll your head down and around from the back of one shoulder to the back of the other. The motion should never be forced or painful.  Repeat  the motion 10-20 times, or until you feel the neck muscles relax and loosen. Repeat __________ times. Complete the exercise __________ times per day. STRENGTHENING EXERCISES - Cervical Strain and Sprain These exercises may help you when beginning to rehabilitate your injury. They may resolve your symptoms with or without further involvement from your physician, physical therapist or athletic trainer. While completing these exercises, remember:   Muscles can gain both the endurance and the strength needed for everyday activities through controlled exercises.  Complete these exercises as instructed by your physician, physical therapist or athletic trainer. Progress the resistance and repetitions only as guided.  You may experience muscle soreness or fatigue, but the pain or discomfort you are trying to eliminate should never worsen during these exercises. If this pain does worsen, stop and make certain you are following the directions exactly. If the pain is still present after adjustments, discontinue the exercise until you can discuss the trouble with your clinician. STRENGTH Cervical Flexors, Isometric  Face a wall, standing about 6 inches away. Place a small pillow, a ball about 6-8 inches in   diameter, or a folded towel between your forehead and the wall.  Slightly tuck your chin and gently push your forehead into the soft object. Push only with mild to moderate intensity, building up tension gradually. Keep your jaw and forehead relaxed.  Hold 10 to 20 seconds. Keep your breathing relaxed.  Release the tension slowly. Relax your neck muscles completely before you start the next repetition. Repeat __________ times. Complete this exercise __________ times per day. STRENGTH- Cervical Lateral Flexors, Isometric   Stand about 6 inches away from a wall. Place a small pillow, a ball about 6-8 inches in diameter, or a folded towel between the side of your head and the wall.  Slightly tuck your  chin and gently tilt your head into the soft object. Push only with mild to moderate intensity, building up tension gradually. Keep your jaw and forehead relaxed.  Hold 10 to 20 seconds. Keep your breathing relaxed.  Release the tension slowly. Relax your neck muscles completely before you start the next repetition. Repeat __________ times. Complete this exercise __________ times per day. STRENGTH  Cervical Extensors, Isometric   Stand about 6 inches away from a wall. Place a small pillow, a ball about 6-8 inches in diameter, or a folded towel between the back of your head and the wall.  Slightly tuck your chin and gently tilt your head back into the soft object. Push only with mild to moderate intensity, building up tension gradually. Keep your jaw and forehead relaxed.  Hold 10 to 20 seconds. Keep your breathing relaxed.  Release the tension slowly. Relax your neck muscles completely before you start the next repetition. Repeat __________ times. Complete this exercise __________ times per day. POSTURE AND BODY MECHANICS CONSIDERATIONS - Cervical Strain and Sprain Keeping correct posture when sitting, standing or completing your activities will reduce the stress put on different body tissues, allowing injured tissues a chance to heal and limiting painful experiences. The following are general guidelines for improved posture. Your physician or physical therapist will provide you with any instructions specific to your needs. While reading these guidelines, remember:  The exercises prescribed by your provider will help you have the flexibility and strength to maintain correct postures.  The correct posture provides the optimal environment for your joints to work. All of your joints have less wear and tear when properly supported by a spine with good posture. This means you will experience a healthier, less painful body.  Correct posture must be practiced with all of your activities, especially  prolonged sitting and standing. Correct posture is as important when doing repetitive low-stress activities (typing) as it is when doing a single heavy-load activity (lifting). PROLONGED STANDING WHILE SLIGHTLY LEANING FORWARD When completing a task that requires you to lean forward while standing in one place for a long time, place either foot up on a stationary 2-4 inch high object to help maintain the best posture. When both feet are on the ground, the low back tends to lose its slight inward curve. If this curve flattens (or becomes too large), then the back and your other joints will experience too much stress, fatigue more quickly and can cause pain.  RESTING POSITIONS Consider which positions are most painful for you when choosing a resting position. If you have pain with flexion-based activities (sitting, bending, stooping, squatting), choose a position that allows you to rest in a less flexed posture. You would want to avoid curling into a fetal position on your side. If your pain   worsens with extension-based activities (prolonged standing, working overhead), avoid resting in an extended position such as sleeping on your stomach. Most people will find more comfort when they rest with their spine in a more neutral position, neither too rounded nor too arched. Lying on a non-sagging bed on your side with a pillow between your knees, or on your back with a pillow under your knees will often provide some relief. Keep in mind, being in any one position for a prolonged period of time, no matter how correct your posture, can still lead to stiffness. WALKING Walk with an upright posture. Your ears, shoulders and hips should all line-up. OFFICE WORK When working at a desk, create an environment that supports good, upright posture. Without extra support, muscles fatigue and lead to excessive strain on joints and other tissues. CHAIR:  A chair should be able to slide under your desk when your back makes  contact with the back of the chair. This allows you to work closely.  The chair's height should allow your eyes to be level with the upper part of your monitor and your hands to be slightly lower than your elbows.  Body position:  Your feet should make contact with the floor. If this is not possible, use a foot rest.  Keep your ears over your shoulders. This will reduce stress on your neck and low back. Document Released: 03/06/2005 Document Revised: 07/01/2012 Document Reviewed: 06/18/2008 [employer] Patient Information 2014 [employer].  
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
## 2013-06-26 ENCOUNTER — Other Ambulatory Visit: Payer: Self-pay | Admitting: Family Medicine

## 2013-06-26 ENCOUNTER — Ambulatory Visit (INDEPENDENT_AMBULATORY_CARE_PROVIDER_SITE_OTHER): Payer: 59 | Admitting: Emergency Medicine

## 2013-06-26 VITALS — BP 126/90 | HR 102 | Temp 98.9°F | Resp 16 | Ht 64.0 in | Wt 184.0 lb

## 2013-06-26 DIAGNOSIS — J209 Acute bronchitis, unspecified: Secondary | ICD-10-CM

## 2013-06-26 DIAGNOSIS — J018 Other acute sinusitis: Secondary | ICD-10-CM

## 2013-06-26 MED ORDER — FLUCONAZOLE 150 MG PO TABS: 150.0000 mg | ORAL_TABLET | Freq: Once | ORAL | Status: DC

## 2013-06-26 MED ORDER — AMOXICILLIN-POT CLAVULANATE 875-125 MG PO TABS: 1.0000 | ORAL_TABLET | Freq: Two times a day (BID) | ORAL | Status: DC

## 2013-06-26 MED ORDER — GUAIFENESIN-DM 100-10 MG/5ML PO SYRP: 5.0000 mL | ORAL_SOLUTION | ORAL | Status: DC | PRN

## 2013-06-26 MED ORDER — PSEUDOEPHEDRINE-GUAIFENESIN ER 60-600 MG PO TB12: 1.0000 | ORAL_TABLET | Freq: Two times a day (BID) | ORAL | Status: DC

## 2013-06-26 NOTE — Telephone Encounter (Signed)
Dr Le, do you want to RF? 

## 2013-06-26 NOTE — Patient Instructions (Signed)

## 2013-06-26 NOTE — Progress Notes (Signed)
[facility] [address], [place] [address] [phone]  Date:  06/26/2013   Name:  Lindsay Marsh   DOB:  01/31/1981   MRN:  [id number]  PCP:  Rockne CoonsLE, THAO PHUONG, DO    Chief Complaint: Sinus Problem   History of Present Illness:  Lindsay FloorKameco Stickley is a 33 y.o. very pleasant female patient who presents with the following:  Ill with nasal congestion, pain in maxillary sinuses and purulent nasal drainage.  Has post nasal drip.  Has a sore throat with swollen glands and a largely non productive cough.  Says she feels feverish and has chills but never took her temperature.  No nausea or vomiting.  No stool change. No improvement with over the counter medications or other home remedies. Denies other complaint or health concern today.   There are no active problems to display for this patient.   Past Medical History  Diagnosis Date  . Allergy     No past surgical history on file.  History  Substance Use Topics  . Smoking status: Current Every Day Smoker -- 6.00 packs/day    Types: Cigarettes  . Smokeless tobacco: Current User  . Alcohol Use: Yes     Comment: Socially    Family History  Problem Relation Age of Onset  . Diabetes Maternal Grandmother   . Hearing loss Maternal Grandfather   . Hypertension Mother   . Hypertension Father   . Diabetes Paternal Grandmother     Allergies  Allergen Reactions  . Flagyl [Metronidazole] Rash    Medication list has been reviewed and updated.  Current Outpatient Prescriptions on File Prior to Visit  Medication Sig Dispense Refill  . cyclobenzaprine (FLEXERIL) 5 MG tablet Take 1 tablet (5 mg total) by mouth at bedtime as needed for muscle spasms.  30 tablet  0  . loratadine-pseudoephedrine (CLARITIN-D 24-HOUR) 10-240 MG per 24 hr tablet Take 1 tablet by mouth daily as needed. For allergies       No current facility-administered medications on file prior to visit.    Review of Systems:  As per HPI,  otherwise negative.    Physical Examination: Filed Vitals:   06/26/13 0917  BP: 126/90  Pulse: 102  Temp: 98.9 F (37.2 C)  Resp: 16   Filed Vitals:   06/26/13 0917  Height: 5\' 4"  (1.626 m)  Weight: 184 lb (83.462 kg)   Body mass index is 31.57 kg/(m^2). Ideal Body Weight: Weight in (lb) to have BMI = 25: 145.3  GEN: WDWN, NAD, Non-toxic, A & O x 3 HEENT: Atraumatic, Normocephalic. Neck supple. No masses, No LAD. Ears and Nose: No external deformity. CV: RRR, No M/G/R. No JVD. No thrill. No extra heart sounds. PULM: CTA B, no wheezes, crackles, rhonchi. No retractions. No resp. distress. No accessory muscle use. ABD: S, NT, ND, +BS. No rebound. No HSM. EXTR: No c/c/e NEURO Normal gait.  PSYCH: Normally interactive. Conversant. Not depressed or anxious appearing.  Calm demeanor.    Assessment and Plan: Sinusitis Bronchitis augmentin mucinex d Robitussin dm    Signed,  Phillips OdorJeffery Tacie Mccuistion, MD

## 2013-06-29 ENCOUNTER — Telehealth: Payer: Self-pay

## 2013-06-29 NOTE — Telephone Encounter (Signed)
Patient was seen last Thursday and needs note for that day to take to her employer as proof she came to the doctor. Cb# [id number]

## 2013-06-29 NOTE — Telephone Encounter (Signed)
Note has been written, printed, and is in [facility]bara's Rx box.

## 2013-06-29 NOTE — Telephone Encounter (Signed)
Advised pt

## 2013-09-08 ENCOUNTER — Ambulatory Visit (INDEPENDENT_AMBULATORY_CARE_PROVIDER_SITE_OTHER): Payer: 59 | Admitting: Emergency Medicine

## 2013-09-08 ENCOUNTER — Ambulatory Visit (INDEPENDENT_AMBULATORY_CARE_PROVIDER_SITE_OTHER): Payer: 59

## 2013-09-08 VITALS — BP 122/76 | HR 88 | Temp 98.4°F | Resp 18 | Ht 64.0 in | Wt 183.6 lb

## 2013-09-08 DIAGNOSIS — M79674 Pain in right toe(s): Secondary | ICD-10-CM

## 2013-09-08 DIAGNOSIS — S93609A Unspecified sprain of unspecified foot, initial encounter: Secondary | ICD-10-CM

## 2013-09-08 DIAGNOSIS — M79609 Pain in unspecified limb: Secondary | ICD-10-CM

## 2013-09-08 DIAGNOSIS — S93504A Unspecified sprain of right lesser toe(s), initial encounter: Secondary | ICD-10-CM

## 2013-09-08 NOTE — Progress Notes (Signed)
[facility] [address], [place] [address] [phone]  Date:  09/08/2013   Name:  Lindsay Marsh Berch   DOB:  12/19/1980   MRN:  [id number]  PCP:  Rockne CoonsLE, THAO PHUONG, DO    Chief Complaint: Foot Injury   History of Present Illness:  Lindsay Marsh Swickard is a 33 y.o. very pleasant female patient who presents with the following:  Kicked a wall last night and injured her right fourth and fifth toes.  Pain worse with ambulation.  Swelling and tenderness.  No bruising.  No improvement with over the counter medications or other home remedies. Denies other complaint or health concern today.   There are no active problems to display for this patient.   Past Medical History  Diagnosis Date  . Allergy     No past surgical history on file.  History  Substance Use Topics  . Smoking status: Current Every Day Smoker -- 6.00 packs/day    Types: Cigarettes  . Smokeless tobacco: Current User  . Alcohol Use: Yes     Comment: Socially    Family History  Problem Relation Age of Onset  . Diabetes Maternal Grandmother   . Hearing loss Maternal Grandfather   . Hypertension Mother   . Hypertension Father   . Diabetes Paternal Grandmother     Allergies  Allergen Reactions  . Flagyl [Metronidazole] Rash    Medication list has been reviewed and updated.  Current Outpatient Prescriptions on File Prior to Visit  Medication Sig Dispense Refill  . cyclobenzaprine (FLEXERIL) 5 MG tablet TAKE 1 TABLET BY MOUTH AT BEDTIME AS NEEDED FOR MUSCLE SPASMS  30 tablet  3  . loratadine-pseudoephedrine (CLARITIN-D 24-HOUR) 10-240 MG per 24 hr tablet Take 1 tablet by mouth daily as needed. For allergies       No current facility-administered medications on file prior to visit.    Review of Systems:  As per HPI, otherwise negative.    Physical Examination: Filed Vitals:   09/08/13 1143  BP: 122/76  Pulse: 88  Temp: 98.4 F (36.9 C)  Resp: 18   Filed Vitals:   09/08/13  1143  Height: 5\' 4"  (1.626 m)  Weight: 183 lb 9.6 oz (83.28 kg)   Body mass index is 31.5 kg/(m^2). Ideal Body Weight: Weight in (lb) to have BMI = 25: 145.3   GEN: WDWN, NAD, Non-toxic, Alert & Oriented x 3 HEENT: Atraumatic, Normocephalic.  Ears and Nose: No external deformity. EXTR: No clubbing/cyanosis/edema NEURO: Normal gait.  PSYCH: Normally interactive. Conversant. Not depressed or anxious appearing.  Calm demeanor.  RIGHT foot:  Tender and swelling 4th and 5th toes.    Assessment and Plan: Sprain foot RICE Aleve   Signed,  Phillips OdorJeffery Heidy Mccubbin, MD   [facility] reading (PRIMARY) by  Dr. Dareen PianoAnderson.  negative.

## 2013-09-08 NOTE — Patient Instructions (Signed)
Turf Toe °Turf toe is a condition of pain at the base of the big toe, located at the ball of the foot. The condition is usually caused from either jamming or extending the toe beyond normal limits (hyperextension). This is the result of pushing off repeatedly when running or jumping. The main problem is pain at the base of the toe, but there may also be stiffness and swelling. The name turf toe comes from the fact that this injury is especially common among athletes who play on hard surfaces, such as artificial turf and basketball courts. Hard surfaces combined with running and jumping makes this a common sports injury. °DIAGNOSIS  °The diagnosis of turftoeisnotdifficult. It is made by examination. X-rays may be taken to make sure there is nobreak in the bone (fracture). Not doing surgery (conservative treatment) solves the problem most of the time. Conservative treatment includes the following home care instructions. °HOME CARE INSTRUCTIONS  °· Apply ice to the sore area for 15-20 minutes, 03-04 times per day while awake, for the first 4 days. Put the ice in a plastic bag and place a towel between the bag of ice and your skin. Use ice if possible following any activities, even after the first four days. °· Keep your leg elevated when possible to lessen swelling and discomfort in the toe. °· Use crutches with non-weight bearing on the affected foot for ten days, or as needed for pain. Then you may walk as the pain allows, or as instructed. Start gradually with weight bearing on the affected foot. Shoes with stiff soles will generally be helpful in limiting pain for the first 1 to 2 weeks. °· Continue to use crutches or a cane until you can stand on your foot without causing pain. °· Only take over-the-counter or prescription medicines for pain, discomfort, or fever as directed by your caregiver. °SEEK IMMEDIATE MEDICAL CARE IF:  °· You have an increase in bruising, swelling, or pain in your toe. °· Pain relief is  not obtained with medications. °Turf toe can return, and problems may be slow to improve. This is more common if you return to athletic activities too soon and do not allow the problem to fully recover. Surgery is rarely needed, but in certain cases it may be necessary. If a bone spur forms and severely limits motion of the toe joint, surgery to remove the spur and improve motion of the big toe may be helpful. °Document Released: 08/26/2001 Document Revised: 05/29/2011 Document Reviewed: 08/11/2008 °ExitCare® Patient Information ©2015 [employer]. This information is not intended to replace advice given to you by your health care provider. Make sure you discuss any questions you have with your health care provider. ° °

## 2013-09-09 ENCOUNTER — Telehealth: Payer: Self-pay

## 2013-09-09 NOTE — Telephone Encounter (Signed)
PATIENT STATES SHE CAME IN THE OFFICE ON Monday FOR PAIN IN HER TOES. SHE IS REQUESTING A DOCTOR'S NOTE FOR WORK. SHE WAS OUT ON Monday AND RETURNED BACK ON TUES. SHE ALSO  NEEDS THE NOTE TO SAY WHAT SHE WAS SEEN FOR. PLEASE CALL HER WHEN IT CAN BE PICKED UP. SHE SAW. DR. Dareen PianoANDERSON. BEST PHONE [phone] (CELL)  MBC

## 2013-09-11 NOTE — Telephone Encounter (Signed)
Pt called again. Note printed and pt AVS printed.  Both at the front desk for pick up. Pt aware.

## 2013-12-03 ENCOUNTER — Telehealth: Payer: Self-pay

## 2013-12-03 NOTE — Telephone Encounter (Signed)
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 Advised in message for pt to RTC to see her for travel council.

## 2013-12-03 NOTE — Telephone Encounter (Signed)
Pt sent the following email to our website:  I am a patient of Dr. Conley Rolls and wonder what travel vaccines you provide  [phone]

## 2014-03-27 ENCOUNTER — Ambulatory Visit: Payer: 59 | Admitting: Family Medicine

## 2014-04-17 ENCOUNTER — Ambulatory Visit: Payer: 59 | Admitting: Family Medicine

## 2014-04-28 ENCOUNTER — Telehealth: Payer: Self-pay

## 2014-04-28 NOTE — Telephone Encounter (Signed)
Pt is needing to talk with someone about getting a referral to ortho

## 2014-04-29 NOTE — Telephone Encounter (Signed)
Called pt to receive details, Left message for pt to call back.
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
## 2014-10-20 ENCOUNTER — Ambulatory Visit (INDEPENDENT_AMBULATORY_CARE_PROVIDER_SITE_OTHER): Payer: 59 | Admitting: Family Medicine

## 2014-10-20 VITALS — BP 118/72 | HR 99 | Temp 99.6°F | Resp 18 | Ht 64.0 in | Wt 187.0 lb

## 2014-10-20 DIAGNOSIS — B373 Candidiasis of vulva and vagina: Secondary | ICD-10-CM | POA: Diagnosis not present

## 2014-10-20 DIAGNOSIS — B3731 Acute candidiasis of vulva and vagina: Secondary | ICD-10-CM

## 2014-10-20 DIAGNOSIS — N898 Other specified noninflammatory disorders of vagina: Secondary | ICD-10-CM

## 2014-10-20 DIAGNOSIS — L298 Other pruritus: Secondary | ICD-10-CM | POA: Diagnosis not present

## 2014-10-20 LAB — POCT WET PREP WITH KOH
Clue Cells Wet Prep HPF POC: NEGATIVE
KOH PREP POC: POSITIVE
Trichomonas, UA: NEGATIVE
YEAST WET PREP PER HPF POC: POSITIVE

## 2014-10-20 MED ORDER — FLUCONAZOLE 150 MG PO TABS
150.0000 mg | ORAL_TABLET | Freq: Once | ORAL | Status: DC
Start: 2014-10-20 — End: 2015-04-25

## 2014-10-20 NOTE — Progress Notes (Signed)
[facility] [address], [place] [address] [phone]  Date:  10/20/2014   Name:  Lindsay Marsh   DOB:  1980/10/22   MRN:  [id number]  PCP:  Lindsay Coons, DO    Chief Complaint: Vaginal Itching   History of Present Illness:  Lindsay Marsh is a 34 y.o. very pleasant female patient who presents with the following:  She notes "terrible itching" of her vaginal area.  This is making it hard to sleep She has not noted any discharge or odor.  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   Past Medical History  Diagnosis Date  . Allergy     History reviewed. No pertinent past surgical history.  History  Substance Use Topics  . Smoking status: Current Every Day Smoker -- 6.00 packs/day    Types: Cigarettes  . Smokeless tobacco: Current User  . Alcohol Use: 0.0 oz/week    0 Standard drinks or equivalent per week     Comment: Socially    Family History  Problem Relation Age of Onset  . Diabetes Maternal Grandmother   . Hearing loss Maternal Grandfather   . Hypertension Mother   . Hypertension Father   . Diabetes Paternal Grandmother     Allergies  Allergen Reactions  . Flagyl [Metronidazole] Rash    Medication list has been reviewed and updated.  Current Outpatient Prescriptions on File Prior to Visit  Medication Sig Dispense Refill  . loratadine-pseudoephedrine (CLARITIN-D 24-HOUR) 10-240 MG per 24 hr tablet Take 1 tablet by mouth daily as needed. For allergies    . cyclobenzaprine  (FLEXERIL) 5 MG tablet TAKE 1 TABLET BY MOUTH AT BEDTIME AS NEEDED FOR MUSCLE SPASMS (Patient not taking: Reported on 10/20/2014) 30 tablet 3   No current facility-administered medications on file prior to visit.    Review of Systems:  As per HPI- otherwise negative.   Physical Examination: Filed Vitals:   10/20/14 1537  BP: 118/72  Pulse: 99  Temp: 99.6 F (37.6 C)  Resp: 18   Filed Vitals:   10/20/14 1537  Height:  (1.626 m)  Weight: 187 lb (84.823 kg)   Body mass index is 32.08 kg/(m^2). Ideal Body Weight: Weight in (lb) to have BMI = 25: 145.3  GEN: WDWN, NAD, Non-toxic, A & O x 3 HEENT: Atraumatic, Normocephalic. Neck supple. No masses, No LAD. Ears and Nose: No external deformity. CV: RRR, No M/G/R. No JVD. No thrill. No extra heart sounds. PULM: CTA B, no wheezes, crackles, rhonchi. No retractions. No resp. distress. No accessory muscle use. ABD: S, NT, ND EXTR: No c/c/e NEURO Normal gait.  PSYCH: Normally interactive. Conversant. Not depressed or anxious appearing.  Calm demeanor.  Pelvic: normal, no vaginal lesions.  She does have thin white discharge, mild amount of clumpy discharge typical of yeast. Uterus normal, no CMT, no adnexal tendereness or masses  Results for orders placed or performed in  visit on 10/20/14  POCT Wet Prep with KOH  Result Value Ref Range   Trichomonas, UA Negative    Clue Cells Wet Prep HPF POC neg    Epithelial Wet Prep HPF POC Moderate Few, Moderate, Many   Yeast Wet Prep HPF POC pos    Bacteria Wet Prep HPF POC Many (A) None, Few   RBC Wet Prep HPF POC 1-4    WBC Wet Prep HPF POC 0-3    KOH Prep POC Positive     Assessment and Plan: Yeast vaginitis  Vaginal itching - Plan: POCT Wet Prep with KOH, fluconazole (DIFLUCAN) 150 MG tablet   Treat for yeast vaginitis with diflucan- she has used this without any sign of allergy Follow-up if not feeling better soon- Sooner if worse.  Advised that she can use gynecort or  topical monistat cream externally for itching as needed   Signed Abbe Amsterdam, MD

## 2014-10-20 NOTE — Patient Instructions (Signed)
Take the diflucan once for yeast infection- repeat in one week if needed Let me know if you are not feeling better soon

## 2015-02-16 ENCOUNTER — Emergency Department (HOSPITAL_BASED_OUTPATIENT_CLINIC_OR_DEPARTMENT_OTHER): Payer: 59

## 2015-02-16 ENCOUNTER — Encounter (HOSPITAL_BASED_OUTPATIENT_CLINIC_OR_DEPARTMENT_OTHER): Payer: Self-pay | Admitting: Emergency Medicine

## 2015-02-16 ENCOUNTER — Emergency Department (HOSPITAL_BASED_OUTPATIENT_CLINIC_OR_DEPARTMENT_OTHER)
Admission: EM | Admit: 2015-02-16 | Discharge: 2015-02-16 | Disposition: A | Discharge status: Home / Self Care | Payer: 59 | Attending: Emergency Medicine | Admitting: Emergency Medicine

## 2015-02-16 DIAGNOSIS — W500XXA Accidental hit or strike by another person, initial encounter: Secondary | ICD-10-CM | POA: Diagnosis not present

## 2015-02-16 DIAGNOSIS — T148XXA Other injury of unspecified body region, initial encounter: Secondary | ICD-10-CM

## 2015-02-16 DIAGNOSIS — M79673 Pain in unspecified foot: Secondary | ICD-10-CM

## 2015-02-16 DIAGNOSIS — Y9389 Activity, other specified: Secondary | ICD-10-CM | POA: Diagnosis not present

## 2015-02-16 DIAGNOSIS — Y9289 Other specified places as the place of occurrence of the external cause: Secondary | ICD-10-CM | POA: Insufficient documentation

## 2015-02-16 DIAGNOSIS — F17210 Nicotine dependence, cigarettes, uncomplicated: Secondary | ICD-10-CM | POA: Diagnosis not present

## 2015-02-16 DIAGNOSIS — S96911A Strain of unspecified muscle and tendon at ankle and foot level, right foot, initial encounter: Secondary | ICD-10-CM | POA: Diagnosis not present

## 2015-02-16 DIAGNOSIS — S99921A Unspecified injury of right foot, initial encounter: Secondary | ICD-10-CM | POA: Diagnosis present

## 2015-02-16 DIAGNOSIS — Y998 Other external cause status: Secondary | ICD-10-CM | POA: Insufficient documentation

## 2015-02-16 DIAGNOSIS — S99922A Unspecified injury of left foot, initial encounter: Secondary | ICD-10-CM | POA: Diagnosis not present
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
## 2015-02-16 NOTE — Discharge Instructions (Signed)

## 2015-02-16 NOTE — ED Notes (Signed)
Patient transported to xray dept. 
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
## 2015-04-25 ENCOUNTER — Emergency Department (HOSPITAL_BASED_OUTPATIENT_CLINIC_OR_DEPARTMENT_OTHER)
Admission: EM | Admit: 2015-04-25 | Discharge: 2015-04-25 | Disposition: A | Discharge status: Home / Self Care | Payer: 59 | Attending: Emergency Medicine | Admitting: Emergency Medicine

## 2015-04-25 ENCOUNTER — Emergency Department (HOSPITAL_BASED_OUTPATIENT_CLINIC_OR_DEPARTMENT_OTHER): Payer: 59

## 2015-04-25 ENCOUNTER — Encounter (HOSPITAL_BASED_OUTPATIENT_CLINIC_OR_DEPARTMENT_OTHER): Payer: Self-pay | Admitting: *Deleted

## 2015-04-25 DIAGNOSIS — J111 Influenza due to unidentified influenza virus with other respiratory manifestations: Secondary | ICD-10-CM | POA: Diagnosis not present

## 2015-04-25 DIAGNOSIS — F17210 Nicotine dependence, cigarettes, uncomplicated: Secondary | ICD-10-CM | POA: Diagnosis not present

## 2015-04-25 DIAGNOSIS — R509 Fever, unspecified: Secondary | ICD-10-CM | POA: Diagnosis present

## 2015-04-25 MED ORDER — SODIUM CHLORIDE 0.9 % IV BOLUS (SEPSIS)
1000.0000 mL | Freq: Once | INTRAVENOUS | Status: AC
  Administered 2015-04-25: 1000 mL via INTRAVENOUS

## 2015-04-25 MED ORDER — IBUPROFEN 800 MG PO TABS
800.0000 mg | ORAL_TABLET | Freq: Once | ORAL | Status: AC
  Administered 2015-04-25: 800 mg via ORAL
  Filled 2015-04-25: qty 1

## 2015-04-25 MED ORDER — TRAMADOL HCL 50 MG PO TABS: 50.0000 mg | ORAL_TABLET | Freq: Four times a day (QID) | ORAL | Status: DC | PRN

## 2015-04-25 MED ORDER — KETOROLAC TROMETHAMINE 30 MG/ML IJ SOLN
30.0000 mg | Freq: Once | INTRAMUSCULAR | Status: AC
  Administered 2015-04-25: 30 mg via INTRAVENOUS
  Filled 2015-04-25: qty 1

## 2015-04-25 NOTE — ED Notes (Signed)
Fever x 2 days.  Denies sore throat.  Reports fever of >105 yesterday.

## 2015-04-25 NOTE — ED Provider Notes (Signed)
CSN: [id number]     Arrival date & time 04/25/15  1933 History  By signing my name below, I, Bethel Born, attest that this documentation has been prepared under the direction and in the presence of Benjiman Core, MD. Electronically Signed: Bethel Born, ED Scribe. 04/25/2015. 8:07 PM     Chief Complaint  Patient presents with  . Fever   The history is provided by the patient. No language interpreter was used.   Lindsay Marsh is a 35 y.o. female who presents to the Emergency Department complaining of fever up to 4 F with onset 3 days ago. Associated symptoms include sinus pain, sore throat that resolved, variably productive cough, and rhinorrhea. Pt denies n/v/d. She is otherwise healthy and denies risk of pregnancy.   Past Medical History  Diagnosis Date  . Allergy    History reviewed. No pertinent past surgical history. Family History  Problem Relation Age of Onset  . Diabetes Maternal Grandmother   . Hearing loss Maternal Grandfather   . Hypertension Mother   . Hypertension Father   . Diabetes Paternal Grandmother    Social History  Substance Use Topics  . Smoking status: Current Every Day Smoker -- 6.00 packs/day    Types: Cigarettes  . Smokeless tobacco: Current User  . Alcohol Use: 0.0 oz/week    0 Standard drinks or equivalent per week     Comment: Socially   OB History    No data available     Review of Systems  Constitutional: Positive for fever.  HENT: Positive for rhinorrhea, sinus pressure and sore throat (resolved).   Respiratory: Positive for cough.   Gastrointestinal: Negative for nausea, vomiting and diarrhea.  All other systems reviewed and are negative.     Allergies  Flagyl  Home Medications   Prior to Admission medications   Medication Sig Start Date End Date Taking? Authorizing Provider  loratadine-pseudoephedrine (CLARITIN-D 24-HOUR) 10-240 MG per 24 hr tablet Take 1 tablet by mouth daily as needed. For allergies    Historical  Provider, MD  traMADol (ULTRAM) 50 MG tablet Take 1 tablet (50 mg total) by mouth every 6 (six) hours as needed. 04/25/15   Benjiman Core, MD   BP 165/87 mmHg  Pulse 99  Temp(Src) 102.2 F (39 C) (Oral)  Resp 18  Ht  (1.6 m)  Wt 190 lb (86.183 kg)  BMI 33.67 kg/m2  SpO2 98%  LMP 04/18/2015 Physical Exam  Constitutional: She is oriented to person, place, and time. She appears well-developed and well-nourished. No distress.       HENT:  Head: Normocephalic and atraumatic.  Right Ear: Tympanic membrane normal.  Left Ear: Tympanic membrane normal.  Mouth/Throat: No oropharyngeal exudate or posterior oropharyngeal erythema.  Eyes: EOM are normal.  Neck: Normal range of motion.  Cardiovascular: Normal rate, regular rhythm and normal heart sounds.   Pulmonary/Chest: Effort normal. She has rales.  Rare scattered rale  Abdominal: Soft. She exhibits no distension. There is no tenderness.  Musculoskeletal: Normal range of motion.  Lymphadenopathy:    She has cervical adenopathy (anterior).  Neurological: She is alert and oriented to person, place, and time.  Skin: Skin is warm and dry.  Warm to touch  Psychiatric: She has a normal mood and affect. Judgment normal.  Nursing note and vitals reviewed.   ED Course  Procedures (including critical care time) COORDINATION OF CARE: 8:01 PM Discussed treatment plan which includes CXR, ibuprofen, and IVF with pt at bedside and pt agreed to  plan.  Labs Review Labs Reviewed - No data to display  Imaging Review Dg Chest 2 View  04/25/2015  CLINICAL DATA:  Fever x 2 days. Denies sore throat. Reports fever of >105 yesterday. EXAM: CHEST  2 VIEW COMPARISON:  None. FINDINGS: The heart size and mediastinal contours are within normal limits. Both lungs are clear. The visualized skeletal structures are unremarkable. IMPRESSION: No active cardiopulmonary disease. Electronically Signed   By: Norva Pavlov M.D.   On: 04/25/2015 20:03   I  have personally reviewed and evaluated these images  as part of my medical decision-making.   EKG Interpretation None      MDM   Final diagnoses:  Influenza   patient presents with fever and flulike symptoms. X-ray does not show pneumonia. Vitals overall reassuring. Does not appear in acute distress. Will discharge home.  I personally performed the services described in this documentation, which was scribed in my presence. The recorded information has been reviewed and is accurate.      Benjiman Core, MD 04/25/15 2147

## 2015-04-25 NOTE — Discharge Instructions (Signed)

## 2015-04-27 ENCOUNTER — Emergency Department (HOSPITAL_BASED_OUTPATIENT_CLINIC_OR_DEPARTMENT_OTHER): Payer: 59

## 2015-04-27 ENCOUNTER — Encounter (HOSPITAL_BASED_OUTPATIENT_CLINIC_OR_DEPARTMENT_OTHER): Payer: Self-pay | Admitting: Emergency Medicine

## 2015-04-27 ENCOUNTER — Emergency Department (HOSPITAL_BASED_OUTPATIENT_CLINIC_OR_DEPARTMENT_OTHER)
Admission: EM | Admit: 2015-04-27 | Discharge: 2015-04-27 | Disposition: A | Discharge status: Home / Self Care | Payer: 59 | Attending: Emergency Medicine | Admitting: Emergency Medicine

## 2015-04-27 DIAGNOSIS — R05 Cough: Secondary | ICD-10-CM | POA: Insufficient documentation

## 2015-04-27 DIAGNOSIS — R079 Chest pain, unspecified: Secondary | ICD-10-CM | POA: Diagnosis present

## 2015-04-27 DIAGNOSIS — R61 Generalized hyperhidrosis: Secondary | ICD-10-CM | POA: Diagnosis not present

## 2015-04-27 DIAGNOSIS — M545 Low back pain: Secondary | ICD-10-CM | POA: Insufficient documentation

## 2015-04-27 DIAGNOSIS — F17210 Nicotine dependence, cigarettes, uncomplicated: Secondary | ICD-10-CM | POA: Insufficient documentation

## 2015-04-27 DIAGNOSIS — R0602 Shortness of breath: Secondary | ICD-10-CM | POA: Insufficient documentation

## 2015-04-27 LAB — CBC WITH DIFFERENTIAL/PLATELET
BASOS ABS: 0 10*3/uL (ref 0.0–0.1)
Basophils Relative: 0 %
Eosinophils Absolute: 0 10*3/uL (ref 0.0–0.7)
Eosinophils Relative: 1 %
HCT: 35.6 % — ABNORMAL LOW (ref 36.0–46.0)
HEMOGLOBIN: 11.7 g/dL — AB (ref 12.0–15.0)
LYMPHS ABS: 1.5 10*3/uL (ref 0.7–4.0)
LYMPHS PCT: 44 %
MCH: 27.4 pg (ref 26.0–34.0)
MCHC: 32.9 g/dL (ref 30.0–36.0)
MCV: 83.4 fL (ref 78.0–100.0)
Monocytes Absolute: 0.5 10*3/uL (ref 0.1–1.0)
Monocytes Relative: 13 %
NEUTROS PCT: 42 %
Neutro Abs: 1.4 10*3/uL — ABNORMAL LOW (ref 1.7–7.7)
Platelets: 244 10*3/uL (ref 150–400)
RBC: 4.27 MIL/uL (ref 3.87–5.11)
RDW: 13.1 % (ref 11.5–15.5)
WBC: 3.4 10*3/uL — AB (ref 4.0–10.5)

## 2015-04-27 LAB — COMPREHENSIVE METABOLIC PANEL
ALT: 12 U/L — ABNORMAL LOW (ref 14–54)
AST: 20 U/L (ref 15–41)
Albumin: 3.5 g/dL (ref 3.5–5.0)
Alkaline Phosphatase: 55 U/L (ref 38–126)
Anion gap: 9 (ref 5–15)
BUN: 7 mg/dL (ref 6–20)
CHLORIDE: 105 mmol/L (ref 101–111)
CO2: 24 mmol/L (ref 22–32)
Calcium: 8.6 mg/dL — ABNORMAL LOW (ref 8.9–10.3)
Creatinine, Ser: 0.88 mg/dL (ref 0.44–1.00)
Glucose, Bld: 87 mg/dL (ref 65–99)
POTASSIUM: 3.4 mmol/L — AB (ref 3.5–5.1)
SODIUM: 138 mmol/L (ref 135–145)
Total Bilirubin: 0.6 mg/dL (ref 0.3–1.2)
Total Protein: 6.9 g/dL (ref 6.5–8.1)

## 2015-04-27 LAB — D-DIMER, QUANTITATIVE (NOT AT ARMC): D DIMER QUANT: 0.28 ug{FEU}/mL (ref 0.00–0.50)

## 2015-04-27 LAB — TROPONIN I

## 2015-04-27 MED ORDER — KETOROLAC TROMETHAMINE 15 MG/ML IJ SOLN
15.0000 mg | Freq: Once | INTRAMUSCULAR | Status: AC
  Administered 2015-04-27: 15 mg via INTRAVENOUS
  Filled 2015-04-27: qty 1

## 2015-04-27 NOTE — ED Notes (Signed)
Pt place on heart monitor 

## 2015-04-27 NOTE — Discharge Instructions (Signed)
Chest Wall Pain °Chest wall pain is pain in or around the bones and muscles of your chest. Sometimes, an injury causes this pain. Sometimes, the cause may not be known. This pain may take several weeks or longer to get better. °HOME CARE °Pay attention to any changes in your symptoms. Take these actions to help with your pain: °· Rest as told by your doctor. °· Avoid activities that cause pain. Try not to use your chest, belly (abdominal), or side muscles to lift heavy things. °· If directed, apply ice to the painful area: °¨ Put ice in a plastic bag. °¨ Place a towel between your skin and the bag. °¨ Leave the ice on for 20 minutes, 2-3 times per day. °· Take over-the-counter and prescription medicines only as told by your doctor. °· Do not use tobacco products, including cigarettes, chewing tobacco, and e-cigarettes. If you need help quitting, ask your doctor. °· Keep all follow-up visits as told by your doctor. This is important. °GET HELP IF: °· You have a fever. °· Your chest pain gets worse. °· You have new symptoms. °GET HELP RIGHT AWAY IF: °· You feel sick to your stomach (nauseous) or you throw up (vomit). °· You feel sweaty or light-headed. °· You have a cough with phlegm (sputum) or you cough up blood. °· You are short of breath. °  °This information is not intended to replace advice given to you by your health care provider. Make sure you discuss any questions you have with your health care provider. °  °Document Released: 08/23/2007 Document Revised: 11/25/2014 Document Reviewed: 06/01/2014 °Elsevier Interactive Patient Education ©2016 Elsevier Inc. ° °

## 2015-04-27 NOTE — ED Provider Notes (Signed)
CSN: [id number]     Arrival date & time 04/27/15  1437 History   By signing my name below, I, Arlan Organ, attest that this documentation has been prepared under the direction and in the presence of Alvira Monday, MD.  Electronically Signed: Arlan Organ, ED Scribe. 04/27/2015. 7:28 PM.   Chief Complaint  Patient presents with  . Chest Pain   Patient is a 35 y.o. female presenting with chest pain. The history is provided by the patient. No language interpreter was used.  Chest Pain Pain location:  Substernal area Pain radiates to:  Does not radiate Pain radiates to the back: no   Pain severity:  Moderate Onset quality:  Gradual Duration:  2 days Timing:  Constant Progression:  Unchanged Chronicity:  New Context: not breathing and no movement   Relieved by:  None tried Worsened by:  Coughing Ineffective treatments:  None tried Associated symptoms: back pain, cough and shortness of breath   Associated symptoms: no abdominal pain, no dizziness, no fever, no headache, no nausea and not vomiting     HPI Comments: Lindsay Marsh is a 35 y.o. female without any pertinent past medical history who presents to the Emergency Department complaining of constant, ongoing chest pain x 2 days. Pain is described as a "crushing feeling". Pt also reports ongoing cough, diaphoresis, and back pain. 1 episode of mild hemoptysis reported early this morning. Discomfort to chest is exacerbated when coughing. No alleviating factors a this time.  No OTC medications attempted prior to arrival. No recent fever, chills, nausea, vomiting, or abdominal pain. No TB risk factors. Pt was evaluated 2/5 for a cough and fever. At that time, CXR did not reveal any PNA and she was safely discharged home. No personal or family history of heart disease. No recent long distance travel.  PCP: Rockne Coons, DO    Past Medical History  Diagnosis Date  . Allergy    History reviewed. No pertinent past surgical  history. Family History  Problem Relation Age of Onset  . Diabetes Maternal Grandmother   . Hearing loss Maternal Grandfather   . Hypertension Mother   . Hypertension Father   . Diabetes Paternal Grandmother    Social History  Substance Use Topics  . Smoking status: Current Every Day Smoker -- 6.00 packs/day    Types: Cigarettes  . Smokeless tobacco: Current User  . Alcohol Use: 0.0 oz/week    0 Standard drinks or equivalent per week     Comment: Socially   OB History    No data available     Review of Systems  Constitutional: Negative for fever and chills.  HENT: Negative for sore throat.   Eyes: Negative for visual disturbance.  Respiratory: Positive for cough and shortness of breath.   Cardiovascular: Positive for chest pain.  Gastrointestinal: Negative for nausea, vomiting and abdominal pain.  Genitourinary: Negative for difficulty urinating.  Musculoskeletal: Positive for back pain. Negative for neck pain.  Skin: Negative for rash.  Neurological: Negative for dizziness, syncope and headaches.  Psychiatric/Behavioral: Negative for confusion.  All other systems reviewed and are negative.     Allergies  Flagyl  Home Medications   Prior to Admission medications   Medication Sig Start Date End Date Taking? Authorizing Provider  loratadine-pseudoephedrine (CLARITIN-D 24-HOUR) 10-240 MG per 24 hr tablet Take 1 tablet by mouth daily as needed. For allergies    Historical Provider, MD  traMADol (ULTRAM) 50 MG tablet Take 1 tablet (50 mg total) by mouth  every 6 (six) hours as needed. 04/25/15   Benjiman Core, MD   Triage Vitals: BP 127/82 mmHg  Pulse 74  Temp(Src) 99.3 F (37.4 C) (Oral)  Resp 16  Ht 5\' 3"  (1.6 m)  Wt 190 lb (86.183 kg)  BMI 33.67 kg/m2  SpO2 99%  LMP 04/18/2015   Physical Exam  Constitutional: She is oriented to person, place, and time. She appears well-developed and well-nourished. No distress.  HENT:  Head: Normocephalic and atraumatic.   Eyes: Conjunctivae and EOM are normal.  Neck: Normal range of motion.  Cardiovascular: Normal rate, regular rhythm, normal heart sounds and intact distal pulses.  Exam reveals no gallop and no friction rub.   No murmur heard. Pulses:      Radial pulses are 2+ on the right side, and 2+ on the left side.       Dorsalis pedis pulses are 2+ on the right side, and 2+ on the left side.  Pulmonary/Chest: Effort normal and breath sounds normal. No respiratory distress. She has no wheezes. She has no rales.  Abdominal: Soft. She exhibits no distension. There is no tenderness. There is no guarding.  Musculoskeletal: Normal range of motion. She exhibits no edema or tenderness.  Neurological: She is alert and oriented to person, place, and time.  Skin: Skin is warm and dry. No rash noted. She is not diaphoretic. No erythema.  Psychiatric: She has a normal mood and affect. Judgment normal.  Nursing note and vitals reviewed.   ED Course  Procedures (including critical care time)  DIAGNOSTIC STUDIES: Oxygen Saturation is 100% on RA, Normal by my interpretation.    COORDINATION OF CARE: 7:24 PM- Will order CBC, CMP, Troponin I, EKG and CXR. Discussed treatment plan with pt at bedside and pt agreed to plan.     Labs Review Labs Reviewed  CBC WITH DIFFERENTIAL/PLATELET - Abnormal; Notable for the following:    WBC 3.4 (*)    Hemoglobin 11.7 (*)    HCT 35.6 (*)    Neutro Abs 1.4 (*)    All other components within normal limits  COMPREHENSIVE METABOLIC PANEL - Abnormal; Notable for the following:    Potassium 3.4 (*)    Calcium 8.6 (*)    ALT 12 (*)    All other components within normal limits  TROPONIN I  D-DIMER, QUANTITATIVE (NOT AT [facility])    Imaging Review Dg Chest 2 View  04/27/2015  CLINICAL DATA:  Chest pain and shortness of breath for 5 days. Initial encounter. EXAM: CHEST  2 VIEW COMPARISON:  PA and lateral chest 04/25/2015. FINDINGS: The lungs are clear. Heart size is normal. There  is no pneumothorax or pleural effusion. No focal bony abnormality. IMPRESSION: Negative chest. Electronically Signed   By: Drusilla Kanner M.D.   On: 04/27/2015 19:10   I have personally reviewed and evaluated these images and lab results as part of my medical decision-making.   EKG Interpretation   Date/Time:  Tuesday April 27 2015 14:58:06 EST Ventricular Rate:  82 PR Interval:  168 QRS Duration: 68 QT Interval:  354 QTC Calculation: 413 R Axis:   79 Text Interpretation:  Normal sinus rhythm Normal ECG No previous ECGs  available Confirmed by [facility] MD, Akacia Boltz ([id number]) on 04/27/2015 5:23:35 PM      MDM   Final diagnoses:  Chest pain, unspecified chest pain type    35 year old female with no significant medical history presents with concern for chest pain. EKG without findings to suggest pericarditis or  other acute ST changes. History is not consistent with dissection. Chest x-ray shows no signs of pneumonia. Patient did report some hemoptysis, low risk Wells, and d-dimer was ordered which was negative. No TB risk factors.  Patient is low risk heart score, and has one negative troponin. Given duration of symptoms, constant nature for 2 days, doubt ACS.  Patient with likely musculoskeletal pain. Recommend close PCP follow up.  Patient discharged in stable condition with understanding of reasons to return.   I personally performed the services described in this documentation, which was scribed in my presence. The recorded information has been reviewed and is accurate.   Alvira Monday, MD 04/29/15 [phone]

## 2015-04-27 NOTE — ED Notes (Signed)
Patient states that she has had Chest pain since she was seen here on Sunday. Reports that she thought the pain was due to her cough. Reports that it has not gone away since Sunday and now more concerned.

## 2016-08-17 DIAGNOSIS — Z124 Encounter for screening for malignant neoplasm of cervix: Secondary | ICD-10-CM | POA: Diagnosis not present

## 2016-08-17 DIAGNOSIS — Z01419 Encounter for gynecological examination (general) (routine) without abnormal findings: Secondary | ICD-10-CM | POA: Diagnosis not present

## 2016-08-17 DIAGNOSIS — R300 Dysuria: Secondary | ICD-10-CM | POA: Diagnosis not present

## 2016-08-28 ENCOUNTER — Encounter (HOSPITAL_COMMUNITY): Payer: Self-pay

## 2016-08-28 ENCOUNTER — Inpatient Hospital Stay (HOSPITAL_COMMUNITY)
Admission: AD | Admit: 2016-08-28 | Discharge: 2016-08-28 | Disposition: A | Discharge status: Home / Self Care | Payer: 59 | Source: Ambulatory Visit | Attending: Obstetrics and Gynecology | Admitting: Obstetrics and Gynecology

## 2016-08-28 DIAGNOSIS — T192XXA Foreign body in vulva and vagina, initial encounter: Secondary | ICD-10-CM | POA: Diagnosis present

## 2016-08-28 DIAGNOSIS — Z048 Encounter for examination and observation for other specified reasons: Secondary | ICD-10-CM | POA: Diagnosis not present

## 2016-08-28 DIAGNOSIS — Z8249 Family history of ischemic heart disease and other diseases of the circulatory system: Secondary | ICD-10-CM | POA: Insufficient documentation

## 2016-08-28 DIAGNOSIS — F17210 Nicotine dependence, cigarettes, uncomplicated: Secondary | ICD-10-CM | POA: Insufficient documentation

## 2016-08-28 DIAGNOSIS — Z883 Allergy status to other anti-infective agents status: Secondary | ICD-10-CM | POA: Diagnosis not present

## 2016-08-28 DIAGNOSIS — Z9189 Other specified personal risk factors, not elsewhere classified: Secondary | ICD-10-CM | POA: Diagnosis not present

## 2016-08-28 NOTE — MAU Provider Note (Signed)
Patient Lindsay Marsh is a 36 y.o. non-pregnant female here with complaints of a retained foreign body since this morning (tampon). History     CSN: [id number]  Arrival date and time: 08/28/16 1941   First Provider Initiated Contact with Patient 08/28/16 2015      Chief Complaint  Patient presents with  . Foreign Body in Vagina   HPI Patient says that she tried to remove her tampon this morning but that the string broke off. She tried again at lunchtime but could not reach it. She went to urgent care, who said that they could not see a tampon, so she came here.  Patient denies pain or vaginal irritation at this time.  OB History    No data available      Past Medical History:  Diagnosis Date  . Allergy     No past surgical history on file.  Family History  Problem Relation Age of Onset  . Diabetes Maternal Grandmother   . Hearing loss Maternal Grandfather   . Hypertension Mother   . Hypertension Father   . Diabetes Paternal Grandmother     Social History  Substance Use Topics  . Smoking status: Current Every Day Smoker    Packs/day: 6.00    Types: Cigarettes  . Smokeless tobacco: Current User  . Alcohol use 0.0 oz/week     Comment: Socially    Allergies:  Allergies  Allergen Reactions  . Flagyl [Metronidazole] Rash    Prescriptions Prior to Admission  Medication Sig Dispense Refill Last Dose  . loratadine-pseudoephedrine (CLARITIN-D 24-HOUR) 10-240 MG per 24 hr tablet Take 1 tablet by mouth daily as needed. For allergies   08/28/2016 at Unknown time    Review of Systems  Respiratory: Negative.   Cardiovascular: Negative.   Gastrointestinal: Negative.   Genitourinary: Negative.   Musculoskeletal: Negative.   Neurological: Negative.    Physical Exam   Blood pressure (!) 152/100, pulse 82, temperature 98.5 F (36.9 C), temperature source Oral, resp. rate 20, height 5\' 3"  (1.6 m), weight 188 lb (85.3 kg), last menstrual period 08/25/2016, SpO2 100  %.  Physical Exam  Constitutional: She is oriented to person, place, and time. She appears well-developed.  HENT:  Head: Normocephalic.  Eyes: Pupils are equal, round, and reactive to light.  Respiratory: Effort normal.  GI: Soft.  Genitourinary: Vagina normal.  Musculoskeletal: Normal range of motion.  Neurological: She is alert and oriented to person, place, and time.  Skin: Skin is warm and dry.  Psychiatric: She has a normal mood and affect.    MAU Course  Procedures  MDM -Upon placement of the speculum, no tampon was visualized. Bimanual exam negative for foreign body.   Assessment and Plan   1. At risk for retained foreign body    2. Patient stable for discharge; reassured patient that tampon has probably passed through using the bathroom.  3. Patient knows to call her ob-gyn if she experiences in pain, irritation, or abnormal discharge.   Charlesetta GaribaldiKathryn Lorraine Kooistra CNM 08/28/2016, 8:22 PM

## 2016-08-28 NOTE — Discharge Instructions (Signed)
Toxic Shock Syndrome °Toxic shock syndrome (TSS) is a life-threatening condition that can result from infections with certain bacteria. When these bacteria infect the body, they release chemicals (toxins) into the bloodstream that can damage the vital organs. TSS can affect healthy or ill people of any age or gender. It is most common in menstruating women. °TSS can result in: °· Shock. °· Heart failure. °· Liver failure. °· Kidney failure. °· Lung damage. °· Respiratory failure. °· Rash and ulcers. °· Blood clotting and bleeding problems. °TSS is a medical emergency that usually requires hospitalization. °CAUSES °This condition is often caused by one of these types of bacteria: °·  Staphylococcus aureus. °·  Streptococcus pyogenes. °TSS occurs after these bacteria enter the body through breaks, cracks, or wounds in the skin or other body tissue. It often occurs when these bacteria grow on a tampon and infect a woman through the vagina. °RISK FACTORS °This condition is more likely to develop in: °· People who have had an injury that breaks the skin. °· People who have recently had surgery. °· People who have a Staphylococcus aureus skin infection. °· People who have foreign material inside the body, such as packings that are used to stop nosebleeds. °· People who have a viral infection. °· People who have diabetes. °· Women who use high-absorbency tampons. °· Women who leave tampons inserted longer than recommended. °· Women who use diaphragms, sponges, or cervical caps. °· Women who have recently given birth. °· Women who have recently had a procedure that involves the sexual organs. °SYMPTOMS °Symptoms of this condition may include: °· High fever. °· Vomiting. °· Diarrhea. °· Sunburn-like rash. °· Increased heart rate. °· Shock. °· Muscle aches and pains. °· Headaches. °· Sore throat. °· Redness of the mouth, throat, or eyes. °· Confusion. °· Shortness of breath. °· Swelling or peeling of the skin on the palms of  the hands or soles of the feet. °There may or may not be signs of infection at the site where the bacteria entered your body. °DIAGNOSIS °This condition may be diagnosed based on a physical exam and other tests. °Tests for men and women include: °· Blood tests. °· Urine tests. °· Blood, wound, and skin sample cultures. °· Cultures from the mucus (sputum) and throat. °· Imaging studies, such as X-rays, MRI, and CT scans. °· Lumbar puncture to check spinal fluid. °Tests that are done only in women include cultures from the vagina. °TREATMENT  °Hospitalization is usually required, even in mild cases. There is no way to know whether a person with early TSS will develop severe medical problems. °Common treatments for men and women include: °· Antibiotic medicines that are given through an IV tube. °· IV fluids and medicines that are given: °? To raise low blood pressure. °? To replace fluids and body salts (electrolytes) that are lost from vomiting or diarrhea. °· Dialysis, if the kidneys are severely affected. °· Removal of any foreign material in the body, such as nasal packings. °Treatment for women includes immediate removal of any tampon, diaphragm, sponge, or cervical cap, if this applies. °In rare cases, men and women may need treatments including: °· Surgery to remove infected tissue. °· A kidney transplant. °HOME CARE INSTRUCTIONS °· Take over-the-counter and prescription medicines only as told by your health care provider. °· If you were prescribed antibiotic medicine, take it as told by your health care provider. Do not stop taking the antibiotic even if you start to feel better or your condition improves. °· (  Women) Do not use tampons if you have had TSS. °PREVENTION °For men and women: °· Follow instructions from your health care provider about proper wound care to prevent infection. °· Do not use gauze packing for a nosebleed. °· Follow instructions for proper care of tattoos and piercings. °For  women: °· Wash your hands with soap and warm water before you insert or remove a tampon, diaphragm, sponge, or cervical cap. °· Use tampons only as told by your health care provider. Follow guidelines for recommended tampon use. °· If you develop a fever or a rash while using a tampon, remove the tampon immediately. °· Choose tampons that have the lowest absorbency that meets your needs. Avoid using super-absorbent tampons. °· Alternate the use of tampons with sanitary napkins or pads during your period. °· Change tampons every 4-8 hours. °· When inserting a tampon, be very careful not to scratch the vaginal lining. Plastic applicators may increase risk because of the sharp edges. If your vagina seems dry, use a water-soluble lubricating jelly to insert the tampon. °· Do not use tampons between periods. °· Do not use tampons that are made with synthetic materials. Use only 100% cotton tampons. °· Do not leave a tampon inserted overnight if you plan to sleep more than 6-8 hours. Put in a fresh tampon before you sleep and remove it immediately upon waking, or use sanitary napkins or pads at night. °· Do not leave in diaphragms, sponges, or cervical caps longer than 12 hours or as told by your health care provider. °· Do not use tampons, a sponge, a cervical cap, or a diaphragm for 12 weeks after delivering a baby. °SEEK MEDICAL CARE IF: °· You are a woman who has delivered a baby within the past 12 weeks and you are exposed to someone who has a confirmed case of strep throat. °SEEK IMMEDIATE MEDICAL CARE IF: °· You have a fever, chills, vomiting, or diarrhea. °· You have a rash or severe bruising. °· You have severe dizziness or you faint. °· You have a seizure. °· You have redness, pain, or swelling around a wound. °· You have blood or pus coming from a wound. °· You have chest pain or difficulty breathing. °· You have sudden or severe pain in your abdomen. °· You have sudden bleeding from your nose or gums. °· You  have red streaks on your skin that come from an injury, a piercing, or a tattoo. °· Your symptoms return after your condition improves. °This information is not intended to replace advice given to you by your health care provider. Make sure you discuss any questions you have with your health care provider. °Document Released: 05/27/2002 Document Revised: 11/25/2014 Document Reviewed: 06/01/2014 °Elsevier Interactive Patient Education © 2017 [employer] Inc. ° °

## 2016-08-28 NOTE — MAU Note (Signed)
Pt here with c/o tampon stuck; placed this morning. Tried to remove it about 1300 and the string came out but not the tampon. Was seen at the urgent care and they were unable to remove it.

## 2016-10-18 ENCOUNTER — Ambulatory Visit (INDEPENDENT_AMBULATORY_CARE_PROVIDER_SITE_OTHER): Payer: 59 | Admitting: Physician Assistant

## 2016-10-18 ENCOUNTER — Encounter: Payer: Self-pay | Admitting: Physician Assistant

## 2016-10-18 VITALS — BP 143/89 | HR 98 | Temp 98.5°F | Resp 16 | Ht 64.0 in | Wt 187.2 lb

## 2016-10-18 DIAGNOSIS — Z23 Encounter for immunization: Secondary | ICD-10-CM | POA: Diagnosis not present

## 2016-10-18 NOTE — Progress Notes (Signed)
   Lindsay Marsh  MRN: [id number] DOB: 11/16/1980  PCP: Lenell AntuLe, Thao P, DO  Subjective:  Pt is a pleasant 36 year old female who presents to clinic for tdap vaccination. Her brother and his wife recently had their first child. She needs updated tdap in order to have contact with the baby. She is feeling well today. Denies recent illness. Last td was in 1999. Last tdap was 1987  Review of Systems  Constitutional: Negative for chills, diaphoresis, fatigue and fever.  HENT: Negative for congestion, postnasal drip, rhinorrhea, sinus pressure, sneezing and sore throat.   Respiratory: Negative for cough, chest tightness, shortness of breath and wheezing.   Cardiovascular: Negative for chest pain and palpitations.  Gastrointestinal: Negative for abdominal pain, diarrhea, nausea and vomiting.  Neurological: Negative for weakness, light-headedness and headaches.    There are no active problems to display for this patient.   Current Outpatient Prescriptions on File Prior to Visit  Medication Sig Dispense Refill  . loratadine-pseudoephedrine (CLARITIN-D 24-HOUR) 10-240 MG per 24 hr tablet Take 1 tablet by mouth daily as needed. For allergies     No current facility-administered medications on file prior to visit.     Allergies  Allergen Reactions  . Flagyl [Metronidazole] Rash     Objective:  BP (!) 143/89   Pulse 98   Temp 98.5 F (36.9 C) (Oral)   Resp 16   Ht 5\' 4"  (1.626 m)   Wt 187 lb 3.2 oz (84.9 kg)   LMP 10/17/2016   SpO2 100%   BMI 32.13 kg/m   Physical Exam  Constitutional: She is oriented to person, place, and time and well-developed, well-nourished, and in no distress. No distress.  Cardiovascular: Normal rate, regular rhythm and normal heart sounds.   Neurological: She is alert and oriented to person, place, and time. GCS score is 15.  Skin: Skin is warm and dry.  Psychiatric: Mood, memory, affect and judgment normal.  Vitals reviewed.   Assessment and Plan :  1.  Need for diphtheria-tetanus-pertussis (Tdap) vaccine 2. Encounter for immunization - Tdap vaccine greater than or equal to 7yo IM - Vaccination given. Anticipatory guidance provided.  RTC PRN.   Marco CollieWhitney Shalana Jardin, PA-C  [facility] 10/18/2016 10:33 AM

## 2016-10-18 NOTE — Patient Instructions (Addendum)
Thank you for coming in today. I hope you feel we met your needs.  Feel free to call PCP if you have any questions or further requests.  Please consider signing up for MyChart if you do not already have it, as this is a great way to communicate with me.  Best,  Whitney McVey, PA-C   DTaP Vaccine (Diphtheria, Tetanus, and Pertussis): What You Need to Know 1. Why get vaccinated? Diphtheria, tetanus, and pertussis are serious diseases caused by bacteria. Diphtheria and pertussis are spread from person to person. Tetanus enters the body through cuts or wounds. DIPHTHERIA causes a thick covering in the back of the throat.  It can lead to breathing problems, paralysis, heart failure, and even death.  TETANUS (Lockjaw) causes painful tightening of the muscles, usually all over the body.  It can lead to "locking" of the jaw so the victim cannot open his mouth or swallow. Tetanus leads to death in up to 2 out of 10 cases.  PERTUSSIS (Whooping Cough) causes coughing spells so bad that it is hard for infants to eat, drink, or breathe. These spells can last for weeks.  It can lead to pneumonia, seizures (jerking and staring spells), brain damage, and death.  Diphtheria, tetanus, and pertussis vaccine (DTaP) can help prevent these diseases. Most children who are vaccinated with DTaP will be protected throughout childhood. Many more children would get these diseases if we stopped vaccinating. DTaP is a safer version of an older vaccine called DTP. DTP is no longer used in the [place]. 2. Who should get DTaP vaccine and when? Children should get 5 doses of DTaP vaccine, one dose at each of the following ages:  2 months  4 months  6 months  15-18 months  4-6 years  DTaP may be given at the same time as other vaccines. 3. Some children should not get DTaP vaccine or should wait  Children with minor illnesses, such as a cold, may be vaccinated. But children who are moderately or severely  ill should usually wait until they recover before getting DTaP vaccine.  Any child who had a life-threatening allergic reaction after a dose of DTaP should not get another dose.  Any child who suffered a brain or nervous system disease within 7 days after a dose of DTaP should not get another dose.  Talk with your doctor if your child: ? had a seizure or collapsed after a dose of DTaP, ? cried non-stop for 3 hours or more after a dose of DTaP, ? had a fever over 105F after a dose of DTaP. Ask your doctor for more information. Some of these children should not get another dose of pertussis vaccine, but may get a vaccine without pertussis, called DT. 4. Older children and adults DTaP is not licensed for adolescents, adults, or children 4 years of age and older. But older people still need protection. A vaccine called Tdap is similar to DTaP. A single dose of Tdap is recommended for people 11 through 38 years of age. Another vaccine, called Td, protects against tetanus and diphtheria, but not pertussis. It is recommended every 10 years. There are separate Vaccine Information Statements for these vaccines. 5. What are the risks from DTaP vaccine? Getting diphtheria, tetanus, or pertussis disease is much riskier than getting DTaP vaccine. However, a vaccine, like any medicine, is capable of causing serious problems, such as severe allergic reactions. The risk of DTaP vaccine causing serious harm, or death, is extremely small. Mild  problems (common)  Fever (up to about 1 child in 4)  Redness or swelling where the shot was given (up to about 1 child in 4)  Soreness or tenderness where the shot was given (up to about 1 child in 4) These problems occur more often after the 4th and 5th doses of the DTaP series than after earlier doses. Sometimes the 4th or 5th dose of DTaP vaccine is followed by swelling of the entire arm or leg in which the shot was given, lasting 1-7 days (up to about 1 child in  45). Other mild problems include:  Fussiness (up to about 1 child in 3)  Tiredness or poor appetite (up to about 1 child in 10)  Vomiting (up to about 1 child in 49) These problems generally occur 1-3 days after the shot. Moderate problems (uncommon)  Seizure (jerking or staring) (about 1 child out of 14,000)  Non-stop crying, for 3 hours or more (up to about 1 child out of 1,000)  High fever, over 105F (about 1 child out of 16,000) Severe problems (very rare)  Serious allergic reaction (less than 1 out of a million doses)  Several other severe problems have been reported after DTaP vaccine. These include: ? Long-term seizures, coma, or lowered consciousness ? Permanent brain damage. These are so rare it is hard to tell if they are caused by the vaccine. Controlling fever is especially important for children who have had seizures, for any reason. It is also important if another family member has had seizures. You can reduce fever and pain by giving your child an aspirin-free pain reliever when the shot is given, and for the next 24 hours, following the package instructions. 6. What if there is a serious reaction? What should I look for? Look for anything that concerns you, such as signs of a severe allergic reaction, very high fever, or behavior changes. Signs of a severe allergic reaction can include hives, swelling of the face and throat, difficulty breathing, a fast heartbeat, dizziness, and weakness. These would start a few minutes to a few hours after the vaccination. What should I do?  If you think it is a severe allergic reaction or other emergency that can't wait, call 9-1-1 or get the person to the nearest hospital. Otherwise, call your doctor.  Afterward, the reaction should be reported to the Vaccine Adverse Event Reporting System (VAERS). Your doctor might file this report, or you can do it yourself through the VAERS web site at www.vaers.SamedayNews.es, or by calling  (307)475-9570. ? VAERS is only for reporting reactions. They do not give medical advice. 7. The National Vaccine Injury Compensation Program The [employer] Vaccine Injury Compensation Program (VICP) is a federal program that was created to compensate people who may have been injured by certain vaccines. Persons who believe they may have been injured by a vaccine can learn about the program and about filing a claim by calling (778)177-6695 or visiting the [employer] website at GoldCloset.com.ee. 8. How can I learn more?  Ask your doctor.  Call your local or state health department.  Contact the Centers for Disease Control and Prevention (CDC): ? Call 765-614-4331 (1-800-CDC-INFO) or ? Visit CDC's website at http://hunter.com/ CDC DTaP Vaccine (Diphtheria, Tetanus, and Pertussis) VIS (08/03/05) This information is not intended to replace advice given to you by your health care provider. Make sure you discuss any questions you have with your health care provider. Document Released: 01/01/2006 Document Revised: 11/25/2015 Document Reviewed: 11/25/2015 Elsevier Interactive Patient Education  2017 [employer].

## 2017-04-17 IMAGING — CR DG FOOT COMPLETE 3+V*L*
3 series · 3 of 3 positions shown · non-contrast
Comparison: None.

CLINICAL DATA: Left foot pain for 1 year. Pain across the first
through fifth metatarsals.

EXAM:
LEFT FOOT - COMPLETE 3+ VIEW

[t foot ap left]
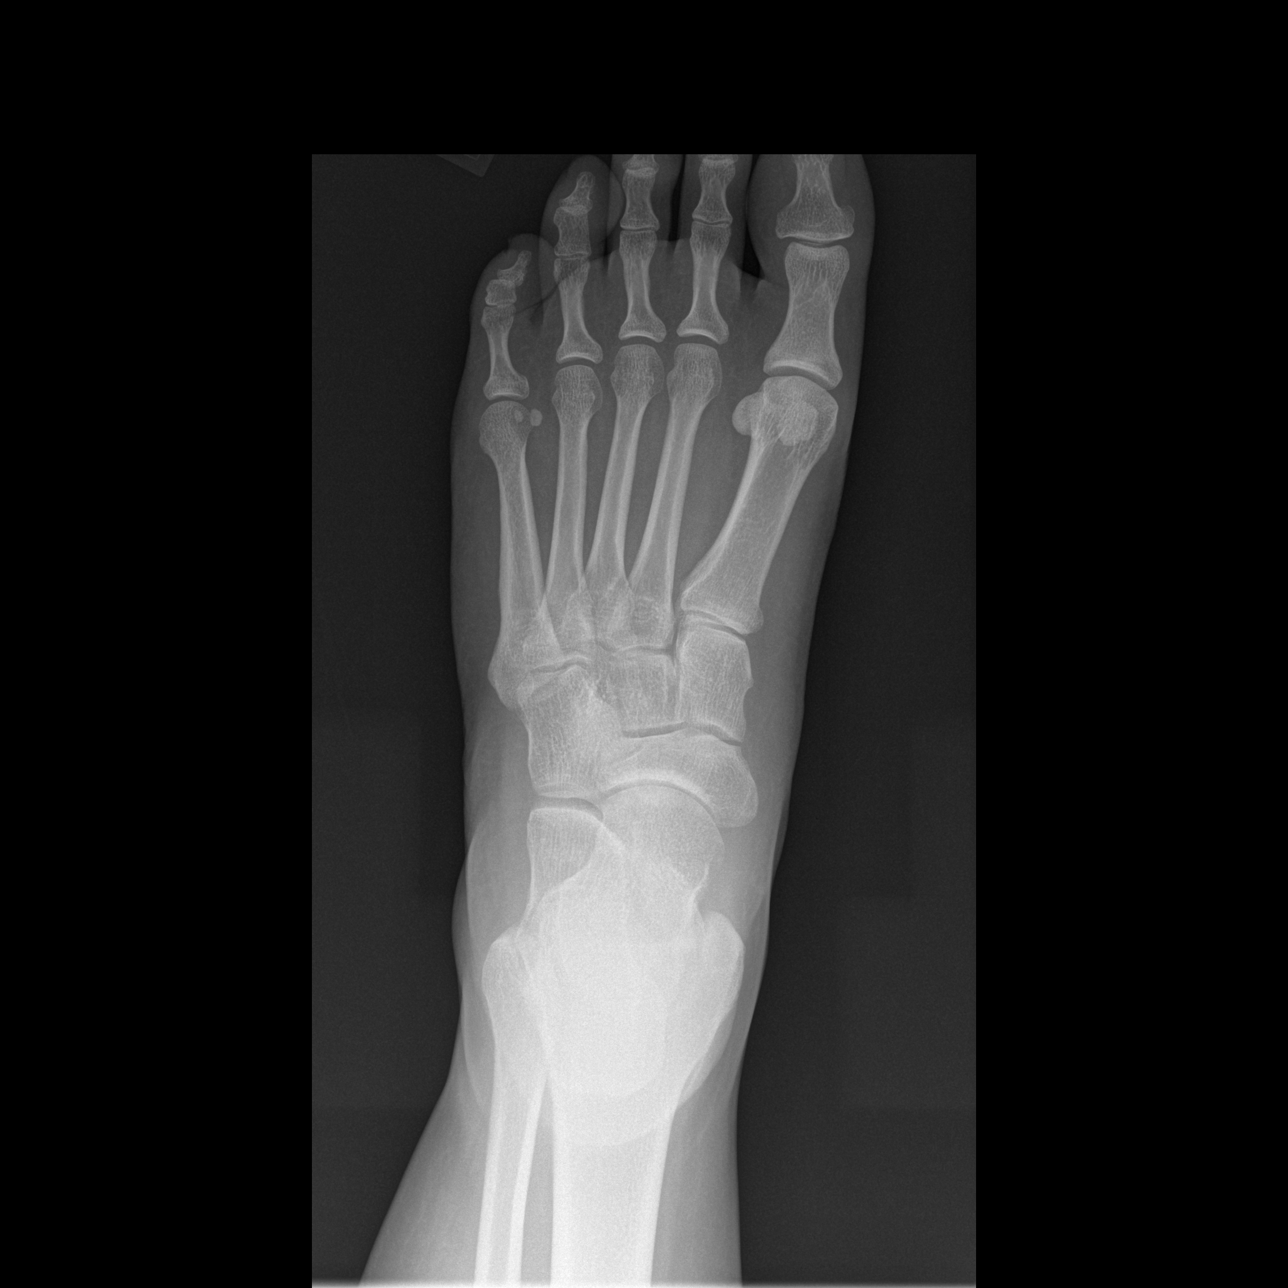

[t foot oblique left]
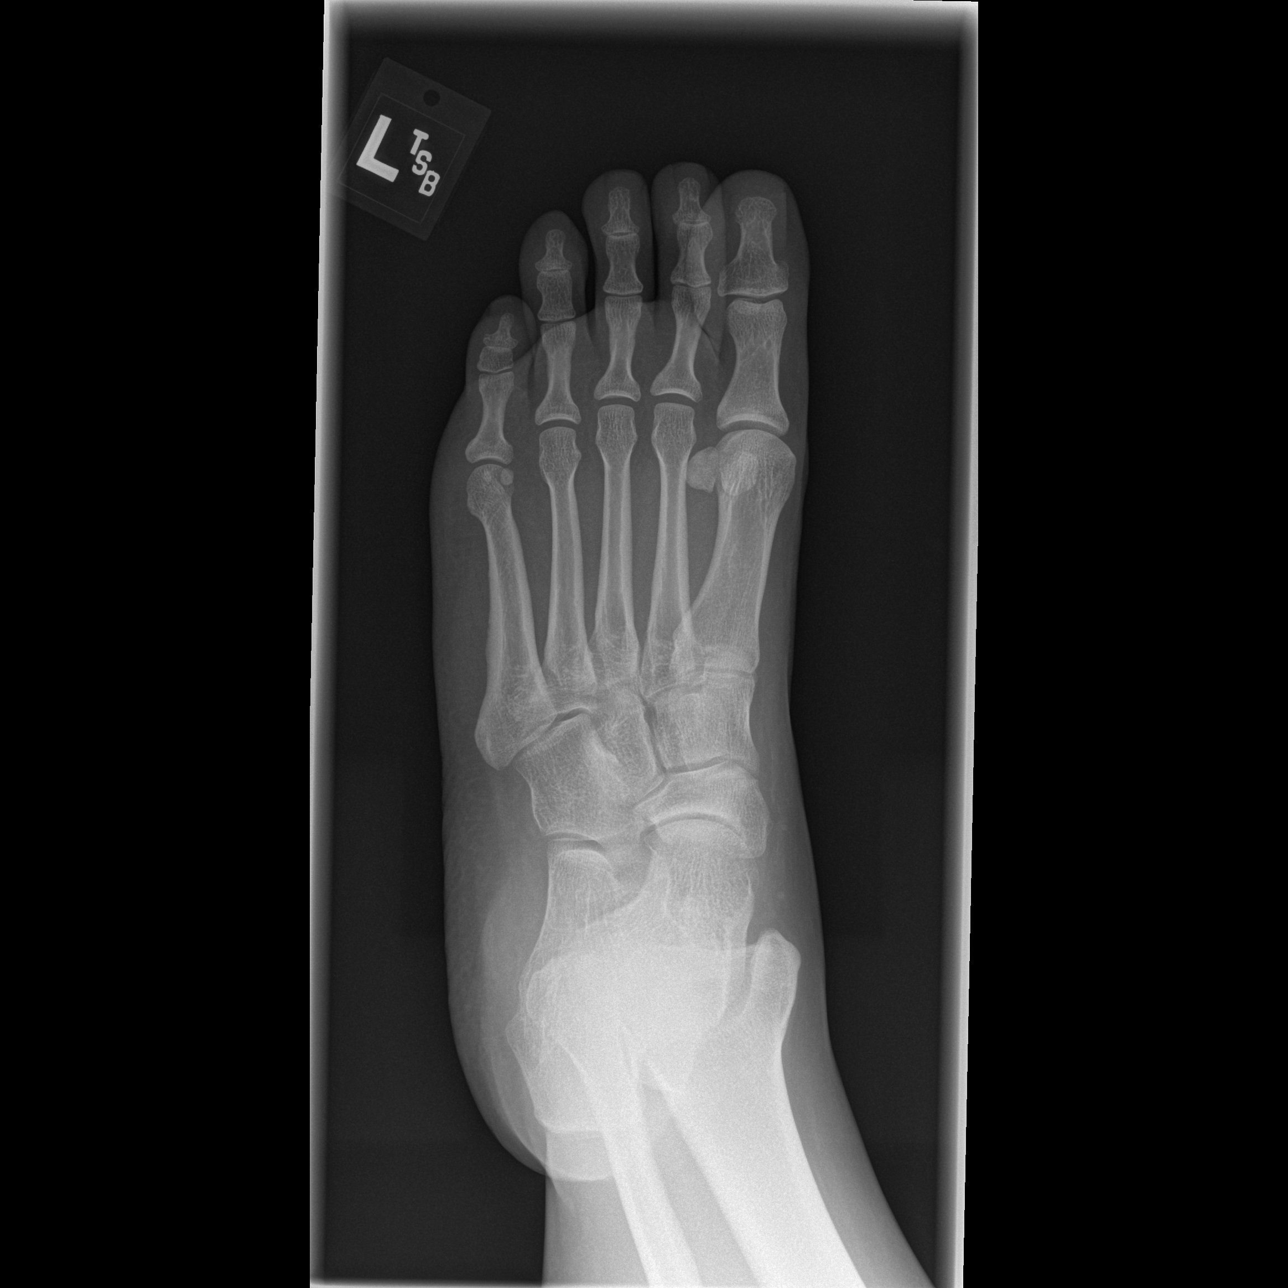

[t foot lat left]
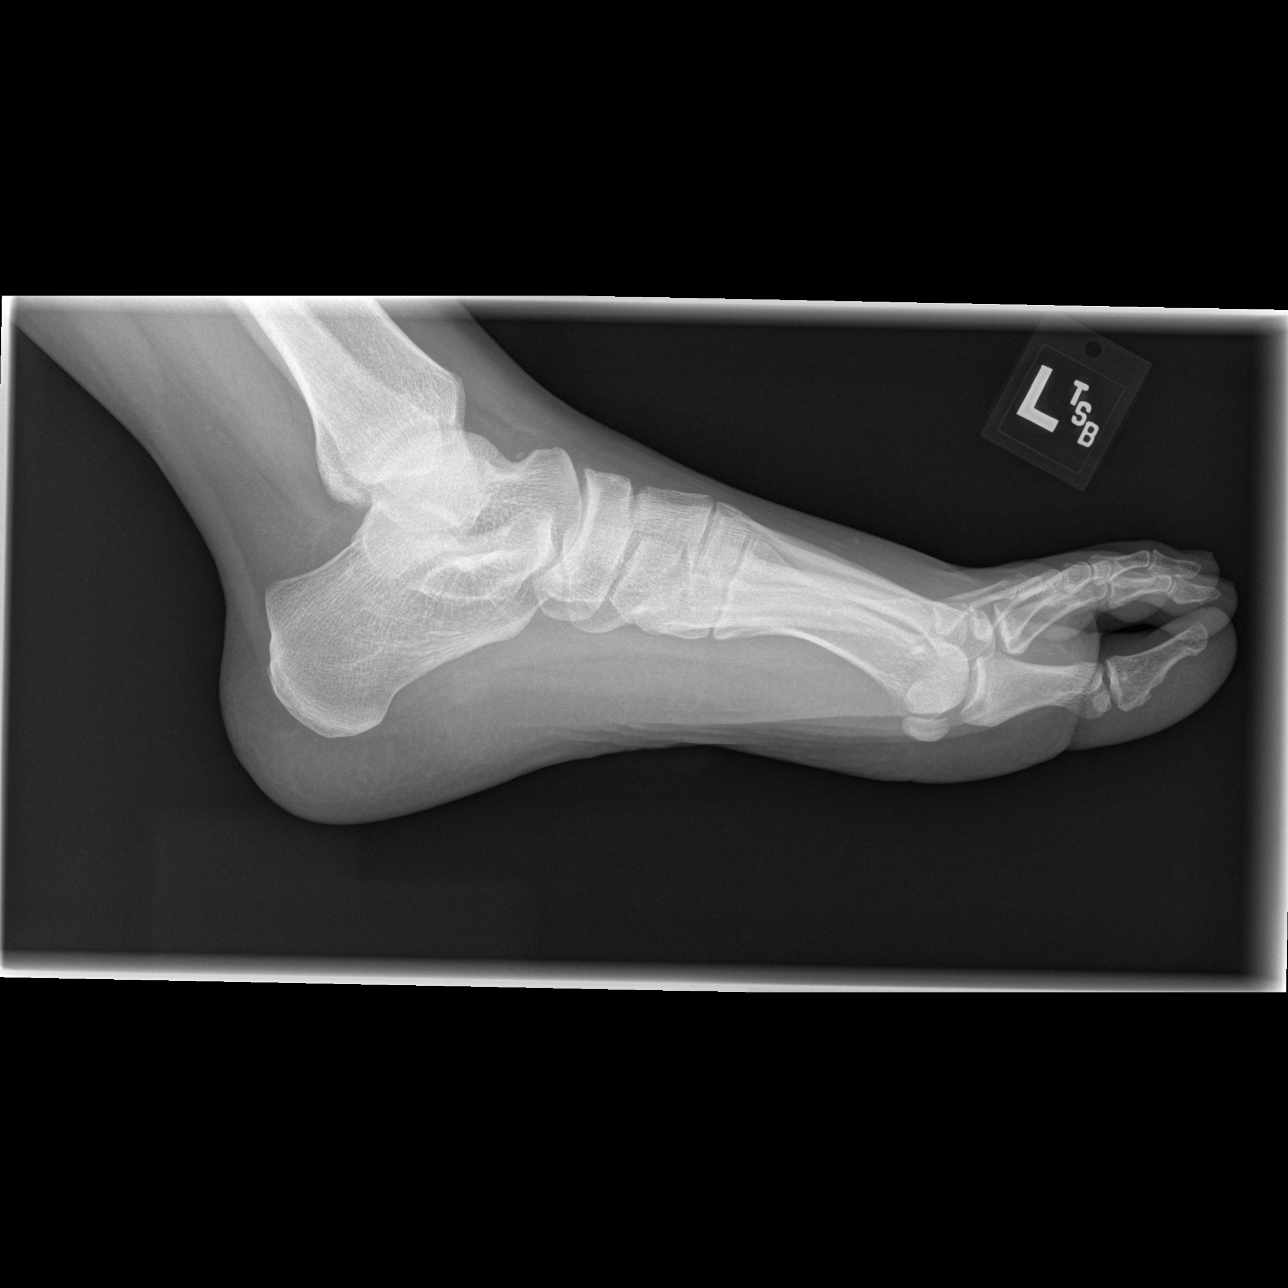

[3 of 3 positions shown; findings below may reference images not displayed]

FINDINGS: There is no evidence of fracture or dislocation. There is no
evidence of arthropathy or other focal bone abnormality. Soft
tissues are unremarkable.
IMPRESSION: Negative.

## 2017-05-01 DIAGNOSIS — J069 Acute upper respiratory infection, unspecified: Secondary | ICD-10-CM | POA: Diagnosis not present

## 2017-05-30 DIAGNOSIS — Z0000 Encounter for general adult medical examination without abnormal findings: Secondary | ICD-10-CM | POA: Diagnosis not present

## 2017-05-30 DIAGNOSIS — Z130 Encounter for screening for diseases of the blood and blood-forming organs and certain disorders involving the immune mechanism: Secondary | ICD-10-CM | POA: Diagnosis not present

## 2017-05-30 DIAGNOSIS — Z13220 Encounter for screening for lipoid disorders: Secondary | ICD-10-CM | POA: Diagnosis not present

## 2017-06-05 IMAGING — CR DG CHEST 2V
2 series · 2 of 2 positions shown · non-contrast
Comparison: PA and lateral chest 04/25/2015.

CLINICAL DATA: Chest pain and shortness of breath for 5 days.
Initial encounter.

EXAM:
CHEST  2 VIEW

[w chest pa]
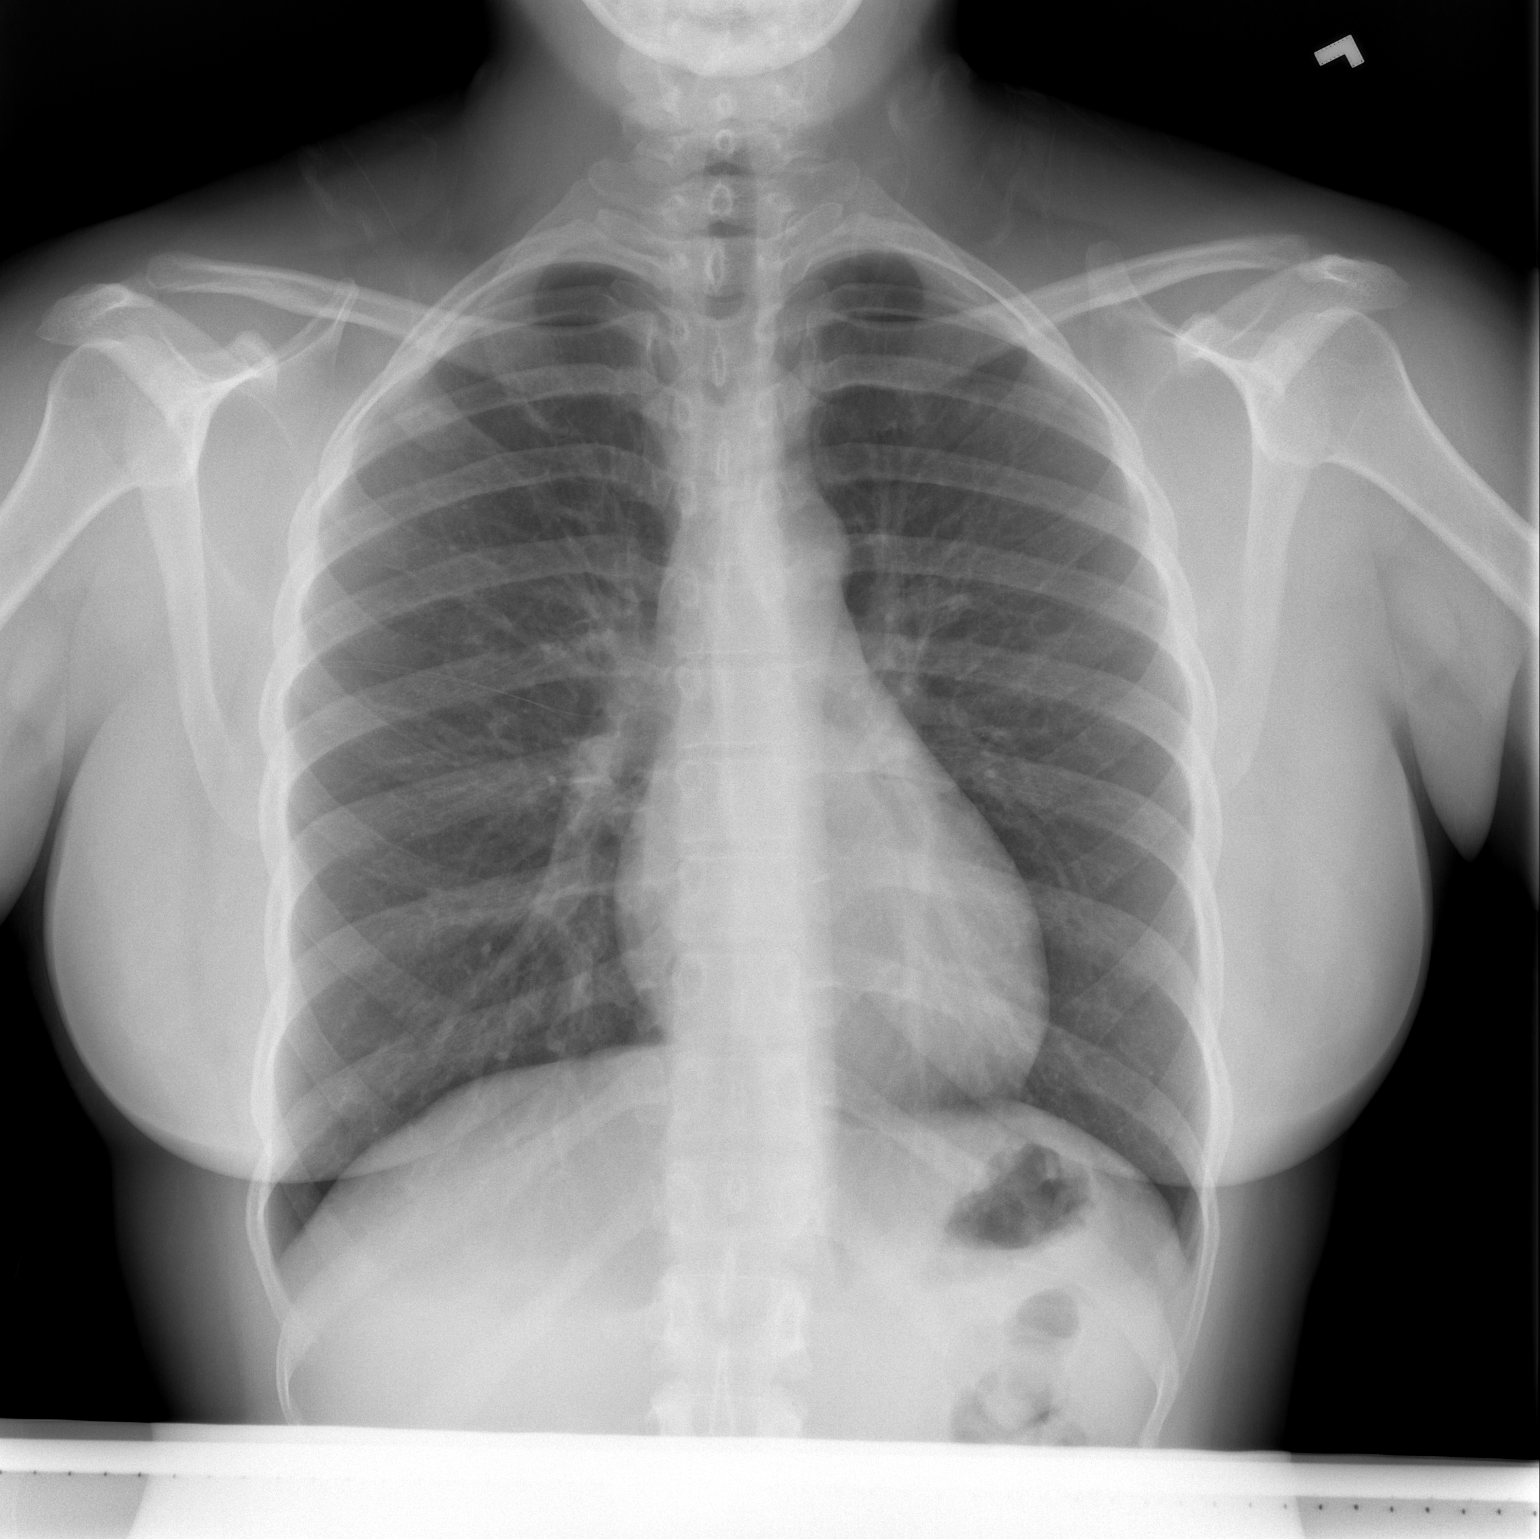

[w chest lat]
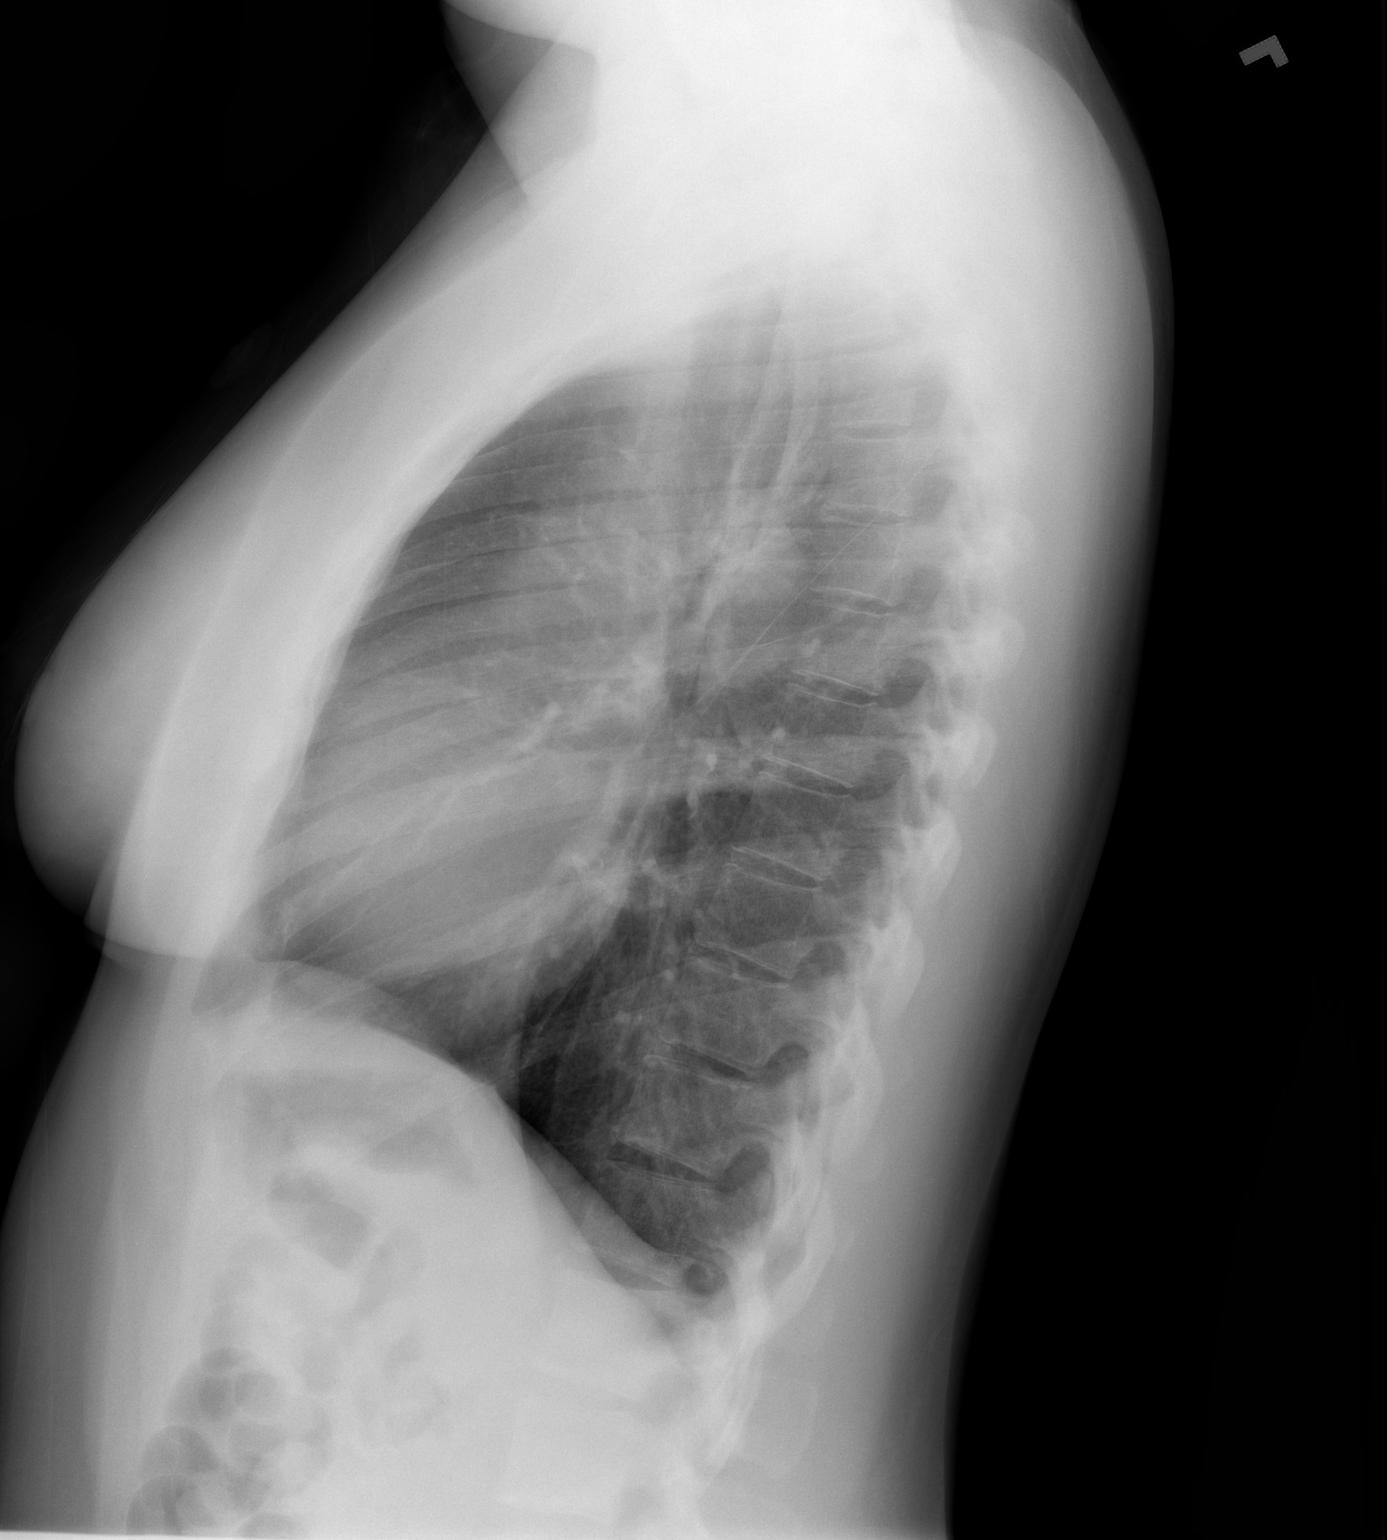

[2 of 2 positions shown; findings below may reference images not displayed]

FINDINGS: The lungs are clear. Heart size is normal. There is no pneumothorax
or pleural effusion. No focal bony abnormality.
IMPRESSION: Negative chest.

## 2017-06-20 DIAGNOSIS — M542 Cervicalgia: Secondary | ICD-10-CM | POA: Diagnosis not present

## 2017-06-20 DIAGNOSIS — M25511 Pain in right shoulder: Secondary | ICD-10-CM | POA: Diagnosis not present

## 2017-06-27 DIAGNOSIS — M542 Cervicalgia: Secondary | ICD-10-CM | POA: Diagnosis not present

## 2017-06-27 DIAGNOSIS — M25551 Pain in right hip: Secondary | ICD-10-CM | POA: Diagnosis not present

## 2017-06-27 DIAGNOSIS — M25511 Pain in right shoulder: Secondary | ICD-10-CM | POA: Diagnosis not present

## 2017-07-11 DIAGNOSIS — M25511 Pain in right shoulder: Secondary | ICD-10-CM | POA: Diagnosis not present

## 2017-07-11 DIAGNOSIS — M542 Cervicalgia: Secondary | ICD-10-CM | POA: Diagnosis not present

## 2017-07-11 DIAGNOSIS — M25551 Pain in right hip: Secondary | ICD-10-CM | POA: Diagnosis not present

## 2017-09-28 DIAGNOSIS — M25559 Pain in unspecified hip: Secondary | ICD-10-CM | POA: Diagnosis not present

## 2017-09-28 DIAGNOSIS — K5904 Chronic idiopathic constipation: Secondary | ICD-10-CM | POA: Diagnosis not present

## 2017-10-17 DIAGNOSIS — Z01419 Encounter for gynecological examination (general) (routine) without abnormal findings: Secondary | ICD-10-CM | POA: Diagnosis not present

## 2017-10-17 DIAGNOSIS — Z6833 Body mass index (BMI) 33.0-33.9, adult: Secondary | ICD-10-CM | POA: Diagnosis not present

## 2017-10-17 DIAGNOSIS — Z130 Encounter for screening for diseases of the blood and blood-forming organs and certain disorders involving the immune mechanism: Secondary | ICD-10-CM | POA: Diagnosis not present

## 2017-11-02 DIAGNOSIS — R102 Pelvic and perineal pain: Secondary | ICD-10-CM | POA: Diagnosis not present

## 2017-11-26 DIAGNOSIS — R220 Localized swelling, mass and lump, head: Secondary | ICD-10-CM | POA: Diagnosis not present

## 2017-11-26 DIAGNOSIS — R0981 Nasal congestion: Secondary | ICD-10-CM | POA: Diagnosis not present

## 2017-11-27 DIAGNOSIS — M25511 Pain in right shoulder: Secondary | ICD-10-CM | POA: Diagnosis not present

## 2017-11-27 DIAGNOSIS — M542 Cervicalgia: Secondary | ICD-10-CM | POA: Diagnosis not present

## 2017-11-27 DIAGNOSIS — M25551 Pain in right hip: Secondary | ICD-10-CM | POA: Diagnosis not present

## 2018-02-10 DIAGNOSIS — R05 Cough: Secondary | ICD-10-CM | POA: Diagnosis not present

## 2018-02-10 DIAGNOSIS — J069 Acute upper respiratory infection, unspecified: Secondary | ICD-10-CM | POA: Diagnosis not present

## 2019-06-26 ENCOUNTER — Ambulatory Visit: Payer: Self-pay | Attending: Internal Medicine

## 2019-06-26 DIAGNOSIS — Z23 Encounter for immunization: Secondary | ICD-10-CM

## 2019-06-26 NOTE — Progress Notes (Signed)
   Covid-19 Vaccination Clinic  Name:  Beatris Belen    MRN: [id number] DOB: December 19, 1980  06/26/2019  Ms. Kinzler was observed post Covid-19 immunization for 30 minutes based on pre-vaccination screening without incident. She was provided with Vaccine Information Sheet and instruction to access the V-Safe system.   Ms. Maddison was instructed to call 911 with any severe reactions post vaccine: Marland Kitchen Difficulty breathing  . Swelling of face and throat  . A fast heartbeat  . A bad rash all over body  . Dizziness and weakness   Immunizations Administered    Name Date Dose VIS Date Route   Pfizer COVID-19 Vaccine 06/26/2019  9:12 AM 0.3 mL 02/28/2019 Intramuscular   Manufacturer: [employer], [employer]   Lot: [id number]   NDC: [address]-7

## 2019-07-16 ENCOUNTER — Ambulatory Visit (INDEPENDENT_AMBULATORY_CARE_PROVIDER_SITE_OTHER): Payer: 59 | Admitting: Otolaryngology

## 2019-07-23 ENCOUNTER — Ambulatory Visit: Payer: 59 | Attending: Internal Medicine

## 2019-07-23 DIAGNOSIS — Z23 Encounter for immunization: Secondary | ICD-10-CM

## 2019-07-23 NOTE — Progress Notes (Signed)
   Covid-19 Vaccination Clinic  Name:  Lindsay Marsh    MRN: [id number] DOB: Jun 14, 1980  07/23/2019  Lindsay Marsh was observed post Covid-19 immunization for 30 minutes based on pre-vaccination screening without incident. She was provided with Vaccine Information Sheet and instruction to access the V-Safe system.   Lindsay Marsh was instructed to call 911 with any severe reactions post vaccine: Marland Kitchen Difficulty breathing  . Swelling of face and throat  . A fast heartbeat  . A bad rash all over body  . Dizziness and weakness   Immunizations Administered    Name Date Dose VIS Date Route   Pfizer COVID-19 Vaccine 07/23/2019  8:15 AM 0.3 mL 05/14/2018 Intramuscular   Manufacturer: [employer], [employer]   Lot: [id number]   NDC: [address]-7

## 2020-11-06 ENCOUNTER — Encounter: Payer: Self-pay | Admitting: Radiology

## 2022-03-03 ENCOUNTER — Other Ambulatory Visit: Payer: Self-pay | Admitting: Family Medicine

## 2022-03-03 ENCOUNTER — Ambulatory Visit
Admission: RE | Admit: 2022-03-03 | Discharge: 2022-03-03 | Disposition: A | Discharge status: Home / Self Care | Payer: 59 | Source: Ambulatory Visit | Attending: Family Medicine | Admitting: Family Medicine

## 2022-03-03 DIAGNOSIS — R52 Pain, unspecified: Secondary | ICD-10-CM

## 2022-11-18 ENCOUNTER — Encounter (HOSPITAL_BASED_OUTPATIENT_CLINIC_OR_DEPARTMENT_OTHER): Payer: Self-pay | Admitting: Family Medicine

## 2022-11-18 ENCOUNTER — Ambulatory Visit (HOSPITAL_BASED_OUTPATIENT_CLINIC_OR_DEPARTMENT_OTHER)
Admission: RE | Admit: 2022-11-18 | Discharge: 2022-11-18 | Disposition: A | Discharge status: Home / Self Care | Payer: 59 | Source: Ambulatory Visit | Attending: Family Medicine | Admitting: Family Medicine

## 2022-11-18 ENCOUNTER — Other Ambulatory Visit (HOSPITAL_BASED_OUTPATIENT_CLINIC_OR_DEPARTMENT_OTHER): Payer: Self-pay | Admitting: Family Medicine

## 2022-11-18 DIAGNOSIS — M545 Low back pain, unspecified: Secondary | ICD-10-CM

## 2022-11-21 ENCOUNTER — Encounter (HOSPITAL_BASED_OUTPATIENT_CLINIC_OR_DEPARTMENT_OTHER): Payer: Self-pay | Admitting: Family Medicine

## 2022-11-21 ENCOUNTER — Other Ambulatory Visit (HOSPITAL_BASED_OUTPATIENT_CLINIC_OR_DEPARTMENT_OTHER): Payer: Self-pay | Admitting: Family Medicine

## 2022-11-21 DIAGNOSIS — M25551 Pain in right hip: Secondary | ICD-10-CM

## 2022-11-22 ENCOUNTER — Ambulatory Visit (HOSPITAL_BASED_OUTPATIENT_CLINIC_OR_DEPARTMENT_OTHER)
Admission: RE | Admit: 2022-11-22 | Discharge: 2022-11-22 | Disposition: A | Discharge status: Home / Self Care | Payer: 59 | Source: Ambulatory Visit | Attending: Family Medicine | Admitting: Family Medicine

## 2022-11-22 ENCOUNTER — Encounter (HOSPITAL_BASED_OUTPATIENT_CLINIC_OR_DEPARTMENT_OTHER): Payer: Self-pay

## 2022-11-22 DIAGNOSIS — M25552 Pain in left hip: Secondary | ICD-10-CM | POA: Diagnosis present

## 2022-11-22 DIAGNOSIS — M25551 Pain in right hip: Secondary | ICD-10-CM | POA: Diagnosis present

## 2023-04-11 ENCOUNTER — Other Ambulatory Visit: Payer: Self-pay | Admitting: Obstetrics and Gynecology

## 2023-04-11 DIAGNOSIS — E2839 Other primary ovarian failure: Secondary | ICD-10-CM

## 2023-09-12 ENCOUNTER — Ambulatory Visit

## 2023-09-12 DIAGNOSIS — E2839 Other primary ovarian failure: Secondary | ICD-10-CM

## 2023-09-12 DIAGNOSIS — Z780 Asymptomatic menopausal state: Secondary | ICD-10-CM | POA: Diagnosis not present

## 2023-09-12 DIAGNOSIS — Z13820 Encounter for screening for osteoporosis: Secondary | ICD-10-CM

## 2023-10-03 ENCOUNTER — Emergency Department (HOSPITAL_COMMUNITY)

## 2023-10-03 ENCOUNTER — Other Ambulatory Visit: Payer: Self-pay

## 2023-10-03 ENCOUNTER — Encounter (HOSPITAL_COMMUNITY): Payer: Self-pay

## 2023-10-03 ENCOUNTER — Emergency Department (HOSPITAL_COMMUNITY)
Admission: EM | Admit: 2023-10-03 | Discharge: 2023-10-04 | Disposition: A | Discharge status: Home / Self Care | Attending: Emergency Medicine | Admitting: Emergency Medicine

## 2023-10-03 DIAGNOSIS — R519 Headache, unspecified: Secondary | ICD-10-CM | POA: Diagnosis not present

## 2023-10-03 DIAGNOSIS — R072 Precordial pain: Secondary | ICD-10-CM | POA: Diagnosis not present

## 2023-10-03 DIAGNOSIS — I10 Essential (primary) hypertension: Secondary | ICD-10-CM | POA: Diagnosis not present

## 2023-10-03 DIAGNOSIS — R079 Chest pain, unspecified: Secondary | ICD-10-CM | POA: Diagnosis present

## 2023-10-03 LAB — BASIC METABOLIC PANEL WITH GFR
Anion gap: 12 (ref 5–15)
BUN: 11 mg/dL (ref 6–20)
CO2: 24 mmol/L (ref 22–32)
Calcium: 10 mg/dL (ref 8.9–10.3)
Chloride: 100 mmol/L (ref 98–111)
Creatinine, Ser: 0.86 mg/dL (ref 0.44–1.00)
GFR, Estimated: >60 mL/min (ref >60)
Glucose, Bld: 101 mg/dL — ABNORMAL HIGH (ref 70–99)
Potassium: 3.7 mmol/L (ref 3.5–5.1)
Sodium: 136 mmol/L (ref 135–145)

## 2023-10-03 LAB — CBC
HCT: 40.8 % (ref 36.0–46.0)
Hemoglobin: 13.7 g/dL (ref 12.0–15.0)
MCH: 28.2 pg (ref 26.0–34.0)
MCHC: 33.6 g/dL (ref 30.0–36.0)
MCV: 84.1 fL (ref 80.0–100.0)
Platelets: 335 K/uL (ref 150–400)
RBC: 4.85 MIL/uL (ref 3.87–5.11)
RDW: 12.6 % (ref 11.5–15.5)
WBC: 5.6 K/uL (ref 4.0–10.5)
nRBC: 0 % (ref 0.0–0.2)

## 2023-10-03 LAB — TROPONIN I (HIGH SENSITIVITY)
Troponin I (High Sensitivity): 3 ng/L (ref <18)
Troponin I (High Sensitivity): 3 ng/L (ref <18)

## 2023-10-03 LAB — HCG, SERUM, QUALITATIVE: Preg, Serum: NEGATIVE

## 2023-10-03 NOTE — ED Provider Triage Note (Signed)
 Emergency Medicine Provider Triage Evaluation Note  Lindsay Marsh , a 43 y.o. female  was evaluated in triage.  Pt complains of centralized chest pain since this morning.  Patient reports that she is having elevated blood pressure for the past few days.  Checked her blood pressure because her mom reported that her eyes looked red monitor check her blood pressure.  Patient reports that they were keeping an eye on her blood pressure last year with her primary care doctor however was deemed that she did not need any medication.  She reports that she woke this morning with some centralized chest pain that was waxing waning but never resolved.  Denies any palpitations.  Did have some shortness of breath this morning that is since resolved.  No radiation of the pain.  Denies any nausea, vomiting abdominal pain.  Not on any hormone use.  She reports that she may have had a left swollen leg a few weeks ago but has not had any since.  Review of Systems  Positive:  Negative:   Physical Exam  BP (!) 164/95   Pulse 74   Temp 98.2 F (36.8 C)   Resp 17   SpO2 98%  Gen:   Awake, no distress, on phone in no acute distress Resp:  Normal effort  MSK:   Moves extremities without difficulty  Other:  Chest Non tender to palpation. 2+ DP, PT, and radial pulses. Does not appear in any acute distress.   Medical Decision Making  Medically screening exam initiated at 7:12 PM.  Appropriate orders placed.  Victory Ku was informed that the remainder of the evaluation will be completed by another provider, this initial triage assessment does not replace that evaluation, and the importance of remaining in the ED until their evaluation is complete.  Labs ordered    Bernis Ernst, [place] 10/03/23 [id number]

## 2023-10-03 NOTE — ED Triage Notes (Signed)
 Pt arrives via POV. PT reports she began experiencing constant chest pain since this morning. Reports associated sob and a slight headache. Pt is AxOx4.

## 2023-10-04 ENCOUNTER — Emergency Department (HOSPITAL_COMMUNITY)

## 2023-10-04 LAB — TROPONIN I (HIGH SENSITIVITY)
Troponin I (High Sensitivity): 3 ng/L (ref <18)
Troponin I (High Sensitivity): <2 ng/L (ref <18)

## 2023-10-04 MED ORDER — HYDROCODONE-ACETAMINOPHEN 5-325 MG PO TABS
1.0000 | ORAL_TABLET | Freq: Once | ORAL | Status: AC
  Administered 2023-10-04: 1 via ORAL
  Filled 2023-10-04: qty 1

## 2023-10-04 MED ORDER — DIPHENHYDRAMINE HCL 25 MG PO CAPS
50.0000 mg | ORAL_CAPSULE | Freq: Once | ORAL | Status: AC
  Administered 2023-10-04: 50 mg via ORAL
  Filled 2023-10-04: qty 2

## 2023-10-04 MED ORDER — AMLODIPINE BESYLATE 5 MG PO TABS: 5.0000 mg | ORAL_TABLET | Freq: Every day | ORAL | 0 refills | Status: AC

## 2023-10-04 MED ORDER — METHYLPREDNISOLONE SODIUM SUCC 40 MG IJ SOLR
40.0000 mg | Freq: Once | INTRAMUSCULAR | Status: AC
  Administered 2023-10-04: 40 mg via INTRAVENOUS
  Filled 2023-10-04: qty 1

## 2023-10-04 MED ORDER — DIPHENHYDRAMINE HCL 50 MG/ML IJ SOLN
50.0000 mg | Freq: Once | INTRAMUSCULAR | Status: AC
  Filled 2023-10-04: qty 1

## 2023-10-04 MED ORDER — IOHEXOL 350 MG/ML SOLN
100.0000 mL | Freq: Once | INTRAVENOUS | Status: AC | PRN
  Administered 2023-10-04: 100 mL via INTRAVENOUS

## 2023-10-04 NOTE — ED Provider Notes (Signed)
 Called to triage to evaluate patient.  Has been waiting since 7 PM with central chest pain since the morning of July 16.  She reports the pain has been progressively worsening since she has been waiting.  Hypertensive with no history of same.  Pain radiates to her mid back.  Has had a diffuse headache for the past several days as well that was gradual in onset.  Denies thunderclap onset. Appears uncomfortable, chest pain not reproducible.  Repeat EKG shows no ST elevation.  Troponins have been negative. Will treat her pain.  Will obtain head CT.  Given worsening chest pain with radiation to the back and new hypertension we will obtain CT aorta dissection study.   Carita Senior, MD 10/04/23 [phone]

## 2023-10-04 NOTE — Discharge Instructions (Addendum)
 Please read and follow all provided instructions.  Your diagnoses today include:  1. Precordial pain   2. Acute nonintractable headache, unspecified headache type   3. Elevated blood pressure reading     Tests performed today include: An EKG of your heart: No changes A chest x-ray Cardiac enzymes - a blood test for heart muscle damage Blood counts and electrolytes CT scan of your head: Brain appeared healthy, no concerning findings CT of the chest abdomen pelvis: Shows normal-appearing blood vessels, no blood clots, you do have some small liver cysts that appear benign as well as some nodules in the lungs which should be rechecked in 1 year given your smoking history Vital signs. See below for your results today.   Medications prescribed:  Amlodipine : Medication for high blood pressure  Take any prescribed medications only as directed.  Follow-up instructions: Please follow-up with your primary care provider as soon as you can for further evaluation of your symptoms.   Return instructions:  SEEK IMMEDIATE MEDICAL ATTENTION IF: You have severe chest pain, especially if the pain is crushing or pressure-like and spreads to the arms, back, neck, or jaw, or if you have sweating, nausea or vomiting, or trouble with breathing. THIS IS AN EMERGENCY. Do not wait to see if the pain will go away. Get medical help at once. Call 911. DO NOT drive yourself to the hospital.  Your chest pain gets worse and does not go away after a few minutes of rest.  You have an attack of chest pain lasting longer than what you usually experience.  You have significant dizziness, if you pass out, or have trouble walking.  You have chest pain not typical of your usual pain for which you originally saw your caregiver.  You have any other emergent concerns regarding your health.  Additional Information: Chest pain comes from many different causes. Your caregiver has diagnosed you as having chest pain that is not  specific for one problem, but does not require admission.  You are at low risk for an acute heart condition or other serious illness.   Your vital signs today were: BP (!) 148/93 (BP Location: Right Arm)   Pulse 64   Temp 98.1 F (36.7 C) (Oral)   Resp 15   SpO2 100%  If your blood pressure (BP) was elevated above 135/85 this visit, please have this repeated by your doctor within one month. --------------    Thank you for the opportunity to take care of you in our Emergency Department. You have been diagnosed with high blood pressure, also known as hypertension. This means that the force of blood against the walls of your blood vessels called is too strong. It also means that your heart has to work harder to move the blood. High blood pressure usually has no symptoms, but over time, it can cause serious health problems such as Heart attack and heart failure Stroke Kidney disease and failure Vision loss With the help from your healthcare provider and some important life style changes, you can manage your blood pressure and protect your health. Please read the instructions provided on hypertension, how to manage it and how to check your blood pressure. Additionally, use the blood pressure log provided to record your blood pressures. Take the blood pressure log with you to your primary care doctor so that they can adjust your blood pressure medications if needed. Please read the instructions on follow-up appointment. Return to the ER or Call 911 right away if you  have any of these symptoms: Chest pain or shortness of breath Severe headache Weakness, tingling, or numbness of your face, arms, or legs (especially on 1 side of the body) Sudden change in vision Confusion, trouble speaking, or trouble understanding speech

## 2023-10-04 NOTE — ED Notes (Signed)
 Patient states chest pain and headache is worse.  MD Rancour made aware.

## 2023-10-04 NOTE — ED Provider Notes (Signed)
 [place] EMERGENCY DEPARTMENT AT [facility] Provider Note   CSN: [id number] Arrival date & time: 10/03/23  1758     Patient presents with: Chest Pain and Shortness of Breath   Lindsay Marsh is a 43 y.o. female.   Patient with history of smoking, no history of hypertension --presents to the emergency department for evaluation of chest pain.  Patient's symptoms started around lunchtime yesterday.  She describes a pressure pain in the mid chest that she will also feel at times between her shoulder blades.  No abdominal pain.  No weakness, numbness, or tingling in the arms or the legs.  No strokelike symptoms.  At times she will have an intense pain at the base of her neck.  No severe headache.  No vomiting or confusion.  Blood pressures have been elevated, this is a new problem.  Patient reports having some episodes of pain that have been similar, last episode about a month ago, but never this persistent.  Patient with prolonged wait time since arrival.        Prior to Admission medications   Medication Sig Start Date End Date Taking? Authorizing Provider  loratadine-pseudoephedrine  (CLARITIN-D 24-HOUR) 10-240 MG per 24 hr tablet Take 1 tablet by mouth daily as needed. For allergies    [provider]    Allergies: Flagyl [metronidazole]    Review of Systems  Updated Vital Signs BP (!) 169/100 (BP Location: Left Arm)   Pulse 97   Temp 98.1 F (36.7 C) (Oral)   Resp 17   SpO2 97%   Physical Exam Vitals and nursing note reviewed.  Constitutional:      Appearance: She is well-developed. She is not diaphoretic.  HENT:     Head: Normocephalic and atraumatic.     Mouth/Throat:     Mouth: Mucous membranes are not dry.  Eyes:     Conjunctiva/sclera: Conjunctivae normal.  Neck:     Vascular: Normal carotid pulses. No JVD.     Trachea: Trachea normal. No tracheal deviation.     Comments: No carotid bruit. Cardiovascular:     Rate and Rhythm: Normal  rate and regular rhythm.     Pulses: No decreased pulses.          Radial pulses are 2+ on the right side and 2+ on the left side.     Heart sounds: Normal heart sounds, S1 normal and S2 normal. No murmur heard. Pulmonary:     Effort: Pulmonary effort is normal. No respiratory distress.     Breath sounds: No wheezing.  Chest:     Chest wall: No tenderness.     Comments: No murmur or abnormal heart sounds. Abdominal:     General: Bowel sounds are normal.     Palpations: Abdomen is soft.     Tenderness: There is no abdominal tenderness. There is no guarding or rebound.     Comments: No abdominal tenderness.  Musculoskeletal:        General: Normal range of motion.     Cervical back: Normal range of motion and neck supple. No muscular tenderness.  Skin:    General: Skin is warm and dry.     Coloration: Skin is not pale.  Neurological:     Mental Status: She is alert.     (all labs ordered are listed, but only abnormal results are displayed) Labs Reviewed  BASIC METABOLIC PANEL WITH GFR - Abnormal; Notable for the following components:      Result Value  Glucose, Bld 101 (*)    All other components within normal limits  CBC  HCG, SERUM, QUALITATIVE  TROPONIN I (HIGH SENSITIVITY)  TROPONIN I (HIGH SENSITIVITY)  TROPONIN I (HIGH SENSITIVITY)   ED ECG REPORT   Date: 10/03/2023 18:17:26  Rate: 68  Rhythm: normal sinus rhythm  QRS Axis: normal  Intervals: normal  ST/T Wave abnormalities: normal  Conduction Disutrbances:none  Narrative Interpretation:   Old EKG Reviewed: unchanged  I have personally reviewed the EKG tracing and agree with the computerized printout as noted.  EKG: EKG Interpretation Date/Time:  Thursday October 04 2023 05:45:29 EDT Ventricular Rate:  91 PR Interval:  180 QRS Duration:  70 QT Interval:  364 QTC Calculation: 447 R Axis:   65  Text Interpretation: Normal sinus rhythm Biatrial enlargement Abnormal ECG When compared with ECG of 03-Oct-2023  18:17, PREVIOUS ECG IS PRESENT Rate faster Confirmed by Carita Senior [id number]) on 10/04/2023 6:12:24 AM  Radiology: DG Chest 2 View Result Date: 10/03/2023 EXAM: 2 VIEW(S) XRAY OF THE CHEST 10/03/2023 07:40:00 PM COMPARISON: April 27 2015 CLINICAL HISTORY: CP. Chest pain, blood pressure has been higher than normal all day. Was 184/104 at its highest. FINDINGS: LUNGS AND PLEURA: No focal pulmonary opacity. No pulmonary edema. No pleural effusion. No pneumothorax. HEART AND MEDIASTINUM: No acute abnormality of the cardiac and mediastinal silhouettes. BONES AND SOFT TISSUES: No acute osseous abnormality. IMPRESSION: 1. No acute process. Electronically signed by: Norman Gatlin MD 10/03/2023 07:54 PM EDT RP Workstation: [id number]     Procedures   Medications Ordered in the ED  methylPREDNISolone  sodium succinate (SOLU-MEDROL ) 40 mg/mL injection 40 mg (has no administration in time range)  diphenhydrAMINE  (BENADRYL ) capsule 50 mg (has no administration in time range)    Or  diphenhydrAMINE  (BENADRYL ) injection 50 mg (has no administration in time range)  HYDROcodone -acetaminophen  (NORCO/VICODIN) 5-325 MG per tablet 1 tablet (1 tablet Oral Given 10/04/23 9394)   ED Course  Patient seen and examined. She has had long wait time. She did have screening exam about an hr prior to my evaluation. History obtained directly from patient. Work-up including labs, imaging, EKG ordered in triage, if performed, were reviewed.    Labs/EKG: Independently reviewed and interpreted.  This included: CBC unremarkable with normal hemoglobin; BMP glucose 101 otherwise unremarkable; troponin 3 >> 3.  Pregnancy negative. EKGs reviewed as above.   Imaging: Independently visualized and interpreted.  This included: Chest x-ray, agree negative.  No evidence of edema or enlarged mediastinum.  Medications/Fluids: None ordered  Most recent vital signs reviewed and are as follows: BP (!) 169/100 (BP Location: Left Arm)    Pulse 97   Temp 98.1 F (36.7 C) (Oral)   Resp 17   SpO2 97%   Initial impression: Chest pain unclear etiology.  No evidence of ACS to this point.  Patient does have some features concerning for dissection.  She reports pain to the back, no chest pain, elevated blood pressure.  She also has a typical headache.  During medical screening exam by other provider, CT of the head was ordered.  CT dissection study was also ordered but canceled after it was noted patient had an IV contrast allergy.  In my opinion, dissection is probably unlikely however continues to be on the differential.  No other obvious cause of the patient's pain has been identified, she remains hypertensive, and having symptoms, but not in distress.  Patient's IV contrast allergy was a skin rash with some minor sloughing of skin which did  not require in person MD evaluation.  I discussed with the patient risks and benefits of imaging versus missing something like a dissection or aneurysm.  After discussion, patient agrees to proceed with pretreatment.  It sounds like her skin reaction overall was mild and not life-threatening.  Dissection could obviously be a life-threatening problem.  12:15 PM Reassessment performed. Patient appears well, comfortable, stable.  She has not had any immediate reactions from her IV contrast.  She tolerated the 4-hour pretreatment well.  Labs personally reviewed and interpreted including: Latest troponin less than 2.  Imaging personally visualized and interpreted including: CTA dissection study, agree no signs of PE, aortic dissection or hematoma, incidental lung and liver nodules noted, patient informed.  She will probably need to have lung nodules rechecked in 1 year for stability given that she is a smoker.  Reviewed pertinent lab work and imaging with patient at bedside. Questions answered.   Most current vital signs reviewed and are as follows: BP (!) 146/94   Pulse 98   Temp 98.1 F (36.7  C) (Oral)   Resp 11   SpO2 100%   Plan: Discharge to home.  We did discuss treatment of hypertension.  She has been persistently hypertensive here.  She would like to try medication.  I will prescribe 5 mg amlodipine .  She has appropriate outpatient follow-up.  Prescriptions written for: Amlodipine   Return and follow-up instructions: I encouraged patient to return to ED with severe chest pain, especially if the pain is crushing or pressure-like and spreads to the arms, back, neck, or jaw, or if they have associated sweating, vomiting, or shortness of breath with the pain, or significant pain with activity. We discussed that the evaluation here today indicates a low-risk of serious cause of chest pain, including heart trouble or a blood clot, but no evaluation is perfect and chest pain can evolve with time. The patient verbalized understanding and agreed.  I encouraged patient to follow-up with their provider in 1 week for recheck.                                     Medical Decision Making Amount and/or Complexity of Data Reviewed Radiology: ordered.  Risk Prescription drug management.   For this patient's complaint of chest pain, the following emergent conditions were considered on the differential diagnosis: acute coronary syndrome, pulmonary embolism, pneumothorax, myocarditis, pericardial tamponade, aortic dissection, thoracic aortic aneurysm complication, esophageal perforation.   Other causes were also considered including: gastroesophageal reflux disease, musculoskeletal pain including costochondritis, pneumonia/pleurisy, herpes zoster, pericarditis.  In regards to possibility of ACS, patient has atypical features of pain, non-ischemic and unchanged EKG and negative troponin(s). Heart score was calculated to be 2.   In regards to possibility of PE, symptoms are atypical for PE and risk profile is low, making PE low likelihood.  CTA was negative.  In regards to the patient's  headache, critical differentials were considered including subarachnoid hemorrhage, intracerebral hemorrhage, epidural/subdural hematoma, pituitary apoplexy, vertebral/carotid artery dissection, giant cell arteritis, central venous thrombosis, reversible cerebral vasoconstriction, acute angle closure glaucoma, idiopathic intracranial hypertension, bacterial meningitis, viral encephalitis, carbon monoxide poisoning, posterior reversible encephalopathy syndrome, pre-eclampsia.   Reg flag symptoms related to these causes were considered including systemic symptoms (fever, weight loss), neurologic symptoms (confusion, mental status change, vision change, associated seizure), acute or sudden thunderclap onset, patient age 54 or older with new or progressive headache, patient of  any age with first headache or change in headache pattern, pregnant or postpartum status, history of HIV or other immunocompromise, history of cancer, headache occurring with exertion, associated neck or shoulder pain, associated traumatic injury, concurrent use of anticoagulation, family history of spontaneous SAH, and concurrent drug use.    Other benign, more common causes of headache were considered including migraine, tension-type headache, cluster headache, referred pain from other cause such as sinus infection, dental pain, trigeminal neuralgia.   On exam, patient has a reassuring neuro exam including baseline mental status, no significant neck pain or meningeal signs, no signs of severe infection or fever.  CT of the head was negative.  The patient's vital signs, pertinent lab work and imaging were reviewed and interpreted as discussed in the ED course. Hospitalization was considered for further testing, treatments, or serial exams/observation. However as patient is well-appearing, has a stable exam over the course of their evaluation, and reassuring studies today, I do not feel that they warrant admission at this time. This plan  was discussed with the patient who verbalizes agreement and comfort with this plan and seems reliable and able to return to the Emergency Department with worsening or changing symptoms.       Final diagnoses:  Precordial pain  Acute nonintractable headache, unspecified headache type  Uncontrolled hypertension    ED Discharge Orders          Ordered    amLODipine  (NORVASC ) 5 MG tablet  Daily        10/04/23 1215               Desiderio Chew, PA-C 10/04/23 1218    Garrick Charleston, MD 10/04/23 1235

## 2023-11-30 ENCOUNTER — Other Ambulatory Visit: Payer: 59

## 2024-02-12 ENCOUNTER — Ambulatory Visit: Admitting: Internal Medicine

## 2024-02-12 ENCOUNTER — Encounter: Payer: Self-pay | Admitting: Internal Medicine

## 2024-02-12 VITALS — BP 132/78 | HR 70 | Temp 98.0°F | Resp 18 | Ht 64.0 in | Wt 192.5 lb

## 2024-02-12 DIAGNOSIS — I10 Essential (primary) hypertension: Secondary | ICD-10-CM

## 2024-02-12 DIAGNOSIS — Z889 Allergy status to unspecified drugs, medicaments and biological substances status: Secondary | ICD-10-CM

## 2024-02-12 DIAGNOSIS — T7819XA Other adverse food reactions, not elsewhere classified, initial encounter: Secondary | ICD-10-CM

## 2024-02-12 DIAGNOSIS — Z888 Allergy status to other drugs, medicaments and biological substances status: Secondary | ICD-10-CM | POA: Diagnosis not present

## 2024-02-12 DIAGNOSIS — T783XXD Angioneurotic edema, subsequent encounter: Secondary | ICD-10-CM

## 2024-02-12 DIAGNOSIS — J3089 Other allergic rhinitis: Secondary | ICD-10-CM

## 2024-02-12 DIAGNOSIS — T783XXA Angioneurotic edema, initial encounter: Secondary | ICD-10-CM | POA: Diagnosis not present

## 2024-02-12 DIAGNOSIS — T7819XD Other adverse food reactions, not elsewhere classified, subsequent encounter: Secondary | ICD-10-CM

## 2024-02-12 DIAGNOSIS — Z91010 Allergy to peanuts: Secondary | ICD-10-CM | POA: Diagnosis not present

## 2024-02-12 DIAGNOSIS — T7800XA Anaphylactic reaction due to unspecified food, initial encounter: Secondary | ICD-10-CM

## 2024-02-12 NOTE — Patient Instructions (Signed)
 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

## 2024-02-12 NOTE — Progress Notes (Signed)
 NEW PATIENT Date of Service/Encounter:  02/12/24 Referring provider: {Blank single:19197::Bain, Mliss NOVAK, PA,none-self referred} Primary care provider: [facility]  Subjective:  Lindsay Marsh is a 43 y.o. female  presenting today for evaluation of drug allergy  History obtained from: chart review and patient.   Discussed the use of AI scribe software for clinical note transcription with the patient, who gave verbal consent to proceed.  History of Present Illness       Other allergy screening: Asthma: {Blank single:19197::yes,no} Rhino conjunctivitis: {Blank single:19197::yes,no} Food allergy: {Blank single:19197::yes,no} Medication allergy: yes Hymenoptera allergy: {Blank single:19197::yes,no} Urticaria: {Blank single:19197::yes,no} Eczema:{Blank single:19197::yes,no} History of recurrent infections suggestive of immunodeficency: {Blank single:19197::yes,no} ***Vaccinations are up to date.   Past Medical History: Past Medical History:  Diagnosis Date   Allergy    Medication List:  Current Outpatient Medications  Medication Sig Dispense Refill   acetaminophen  (TYLENOL ) 500 MG tablet Take 1,000 mg by mouth 2 (two) times daily as needed for moderate pain (pain score 4-6) or headache.     amLODipine  (NORVASC ) 5 MG tablet Take 1 tablet (5 mg total) by mouth daily. 30 tablet 0   CALCIUM-CHOLECALCIFEROL-ZINC PO Take 1 tablet by mouth daily at 12 noon.     Multiple Vitamins-Minerals (MULTIVITAMIN WOMEN) TABS Take 1 tablet by mouth daily at 12 noon.     Vitamin D-Vitamin K (VITAMIN K2-VITAMIN D3 PO) Take 1 tablet by mouth daily at 12 noon.     No current facility-administered medications for this visit.   Known Allergies:  Allergies  Allergen Reactions   Flagyl [Metronidazole] Rash   Cherry Swelling and Other (See Comments)    Throat closes   Food Itching    most fruit - mouth, throat itching  most tree nuts -  mouth, throat itching   Latex Dermatitis   Glucophage [Metformin] Dermatitis and Rash   Iodinated Contrast Media Dermatitis, Rash and Other (See Comments)    Skin peeling on rash, tolerated 4hr pretreatment with no immediate reaction 10/04/2023   Past Surgical History: History reviewed. No pertinent surgical history. Family History: Family History  Problem Relation Age of Onset   Hypertension Mother    Hypertension Father    Asthma Brother    Diabetes Maternal Grandmother    Hearing loss Maternal Grandfather    Diabetes Paternal Grandmother    Social History: Teela lives single-family home is 44 years old.  No water damage.  Laminate throughout.  Electric heating window for cooling.  No pets.  No roaches in the house and bed is not 2 feet off floor.  Dust mite precautions in bed and pillows.  No tobacco exposure.  Works as a corporate treasurer.  Smoked from 2005-20 25, 24-28-pack-year history.   ROS:  All other systems negative except as noted per HPI.  Objective:  Blood pressure 132/78, pulse 70, temperature 98 F (36.7 C), resp. rate 18, height 5' 4 (1.626 m), weight 192 lb 8 oz (87.3 kg), SpO2 97%. Body mass index is 33.04 kg/m. Physical Exam:  General Appearance:  Alert, cooperative, no distress, appears stated age  Head:  Normocephalic, without obvious abnormality, atraumatic  Eyes:  Conjunctiva clear, EOM's intact  Ears EACs normal bilaterally  Nose: Nares normal,    Throat: Lips, tongue normal; teeth and gums normal,    Neck: Supple, symmetrical  Lungs:    , Respirations unlabored, no coughing  Heart:   , Appears well perfused  Extremities: No edema  Skin: Skin color, texture, turgor normal  and no rashes or lesions on visualized portions of skin  Neurologic: No gross deficits   Diagnostics: None done    Labs:  Lab Orders         Tryptase         IgE Nut Prof. w/Component Rflx         Allergen Profile, Food-Fruit         Allergen, Cherry, [id number]         IgE        Assessment and Plan  Overall I suspect her allergic history to include her environmental allergies, food allergies, drug allergies are representative of a portion of true IgE mediated reactions as well as a portion of underlying mast cell activation processes.  At this point and think she would benefit from Xolair to raise her reaction threshold.  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  Other:    Thank you for your kind referral. I appreciate the opportunity to take part in Sanyla's care. Please do not hesitate to contact me with questions.  Sincerely,  Thank you so much for letting me partake in your care today.  Don't hesitate to reach out if you have any additional concerns!  Hargis Springer, MD  [facility] [place], [place]

## 2024-02-16 LAB — IGE NUT PROF. W/COMPONENT RFLX

## 2024-02-18 LAB — IGE NUT PROF. W/COMPONENT RFLX
F017-IgE Hazelnut (Filbert): 4.38 kU/L — AB
F018-IgE Brazil Nut: <0.1 kU/L
F020-IgE Almond: 2 kU/L — AB
F202-IgE Cashew Nut: <0.1 kU/L
F203-IgE Pistachio Nut: 0.32 kU/L — AB
F256-IgE Walnut: 0.19 kU/L — AB
Macadamia Nut, IgE: 0.67 kU/L — AB
Peanut, IgE: 0.66 kU/L — AB
Pecan Nut IgE: <0.1 kU/L

## 2024-02-18 LAB — ALLERGEN PROFILE, FOOD-FRUIT
Allergen Apple, IgE: 0.95 kU/L — AB
Allergen Banana IgE: 0.16 kU/L — AB
Allergen Grape IgE: <0.1 kU/L
Allergen Pear IgE: 1.45 kU/L — AB
Allergen, Peach f95: 9.33 kU/L — AB

## 2024-02-18 LAB — PANEL 604726
Cor A 1 IgE: 9.82 kU/L — AB
Cor A 14 IgE: <0.1 kU/L
Cor A 8 IgE: <0.1 kU/L
Cor A 9 IgE: <0.1 kU/L

## 2024-02-18 LAB — PANEL 604721
Jug R 1 IgE: <0.1 kU/L
Jug R 3 IgE: <0.1 kU/L

## 2024-02-18 LAB — IGE: IgE (Immunoglobulin E), Serum: 205 [IU]/mL (ref 6–495)

## 2024-02-18 LAB — PEANUT COMPONENTS
F352-IgE Ara h 8: 2.67 kU/L — AB
F422-IgE Ara h 1: <0.1 kU/L
F423-IgE Ara h 2: <0.1 kU/L
F424-IgE Ara h 3: <0.1 kU/L
F427-IgE Ara h 9: <0.1 kU/L
F447-IgE Ara h 6: <0.1 kU/L

## 2024-02-18 LAB — TRYPTASE: Tryptase: 6.1 ug/L (ref 2.2–13.2)

## 2024-02-18 LAB — ALLERGEN COMPONENT COMMENTS

## 2024-02-18 LAB — ALLERGEN, CHERRY, F242: F242-IgE Bing Cherry: 3.48 kU/L — AB

## 2024-02-27 ENCOUNTER — Ambulatory Visit: Payer: Self-pay | Admitting: Internal Medicine

## 2024-02-27 NOTE — Progress Notes (Signed)
 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    Staff - can we let patient know that marker for mast cell disease was normal.  Blood work showed persistent allergies to peanut , tree nut, peach pear, cherry.  We have submitted for xolair approval.  Thanks!

## 2024-02-28 NOTE — Addendum Note (Signed)
 Addended by: OTHA MADELIN HERO on: 02/28/2024 04:36 PM   Modules accepted: Orders

## 2024-02-29 ENCOUNTER — Telehealth: Payer: Self-pay

## 2024-02-29 DIAGNOSIS — T7800XA Anaphylactic reaction due to unspecified food, initial encounter: Secondary | ICD-10-CM

## 2024-02-29 NOTE — Telephone Encounter (Signed)
 Please initiate prior authorization for Xolair 450mg  q28 days for food allergy

## 2024-03-03 ENCOUNTER — Telehealth: Payer: Self-pay

## 2024-03-03 NOTE — Telephone Encounter (Signed)
*  [facility]  Pharmacy Patient Advocate Encounter   Received notification from Pt Calls Messages that prior authorization for Xolair 300MG /2ML syringes   is required/requested.   Insurance verification completed.   The patient is insured through [facility].   Per test claim: PA required; PA started via CoverMyMeds. KEY [id number] . Waiting for clinical questions to populate.

## 2024-03-03 NOTE — Telephone Encounter (Signed)
 PA submitted, pending determination.

## 2024-03-03 NOTE — Telephone Encounter (Signed)
 Submitted to plan with additional documentation and attached labs.

## 2024-03-07 NOTE — Telephone Encounter (Signed)
 Resubmitted with chart notes and labs, pending determination. Key: [id number]

## 2024-03-10 ENCOUNTER — Other Ambulatory Visit (HOSPITAL_COMMUNITY): Payer: Self-pay

## 2024-03-10 ENCOUNTER — Other Ambulatory Visit: Payer: Self-pay

## 2024-03-10 NOTE — Telephone Encounter (Signed)
 Your request has been approved Your PA request has been approved. Additional information will be provided in the approval communication. (Message 1145) Authorization Expiration06/19/2026  Showing must fill via [facility] - [phone]

## 2024-03-11 MED ORDER — OMALIZUMAB 150 MG/ML ~~LOC~~ SOSY: 150.0000 mg | PREFILLED_SYRINGE | SUBCUTANEOUS | 11 refills | Status: AC

## 2024-03-11 MED ORDER — XOLAIR 300 MG/2ML ~~LOC~~ SOSY: 300.0000 mg | PREFILLED_SYRINGE | SUBCUTANEOUS | 11 refills | Status: AC

## 2024-03-18 ENCOUNTER — Telehealth: Payer: Self-pay | Admitting: Internal Medicine

## 2024-03-18 ENCOUNTER — Telehealth: Payer: Self-pay | Admitting: *Deleted

## 2024-03-18 NOTE — Telephone Encounter (Signed)
 Pt states she never received updates about next steps, she keeps getting notifications for xolair  approval and had questions about that as well. I advised pt to reach out to Tammy about xolair  approval and questions. Pt request a call back if follow up appt is needed.

## 2024-03-18 NOTE — Telephone Encounter (Signed)
 I will relay this info to her from the lab results. Dr Lorin when would you like her to schedule a follow up?

## 2024-03-18 NOTE — Telephone Encounter (Signed)
 Patient called and had no idea about the Xolair  submit from [place]. Had multiple questions regarding her lab results and Xolair . She also advised she was not signed up for copay card. I spoke to her about all of this and signed her up for copay card and info given to [employer]. I will reach out to schedule start of therapy when rx scheduled for delivery

## 2024-03-19 NOTE — Telephone Encounter (Signed)
 I spoke to patient at length yesterday and she is going to start Xolair  once delivery scheduled
